# Patient Record
Sex: Male | Born: 1980 | Race: White | Hispanic: No | Marital: Married | Smoking: Never smoker
Health system: Southern US, Community
[De-identification: ages and names within clinical notes are randomized; demographics above are authoritative.]

## PROBLEM LIST (undated history)

## (undated) DIAGNOSIS — Z9889 Other specified postprocedural states: Secondary | ICD-10-CM

## (undated) DIAGNOSIS — Z800 Family history of malignant neoplasm of digestive organs: Secondary | ICD-10-CM

## (undated) DIAGNOSIS — G5601 Carpal tunnel syndrome, right upper limb: Secondary | ICD-10-CM

## (undated) DIAGNOSIS — I10 Essential (primary) hypertension: Secondary | ICD-10-CM

## (undated) DIAGNOSIS — K219 Gastro-esophageal reflux disease without esophagitis: Secondary | ICD-10-CM

## (undated) DIAGNOSIS — M1990 Unspecified osteoarthritis, unspecified site: Secondary | ICD-10-CM

## (undated) DIAGNOSIS — R112 Nausea with vomiting, unspecified: Secondary | ICD-10-CM

## (undated) DIAGNOSIS — E785 Hyperlipidemia, unspecified: Secondary | ICD-10-CM

## (undated) HISTORY — DX: Unspecified osteoarthritis, unspecified site: M19.90

## (undated) HISTORY — DX: Carpal tunnel syndrome, right upper limb: G56.01

## (undated) HISTORY — DX: Hyperlipidemia, unspecified: E78.5

## (undated) HISTORY — PX: LAMINECTOMY: SHX219

## (undated) HISTORY — DX: Gastro-esophageal reflux disease without esophagitis: K21.9

## (undated) HISTORY — PX: GUM SURGERY: SHX658

## (undated) HISTORY — DX: Family history of malignant neoplasm of digestive organs: Z80.0

---

## 2015-08-16 HISTORY — PX: LAMINECTOMY: SHX219

## 2016-08-15 HISTORY — PX: VARICOCELECTOMY: SHX1084

## 2017-08-15 HISTORY — PX: SKIN CANCER EXCISION: SHX779

## 2018-01-04 DIAGNOSIS — I10 Essential (primary) hypertension: Secondary | ICD-10-CM | POA: Insufficient documentation

## 2018-01-04 DIAGNOSIS — E7800 Pure hypercholesterolemia, unspecified: Secondary | ICD-10-CM | POA: Insufficient documentation

## 2019-01-20 ENCOUNTER — Other Ambulatory Visit: Payer: Self-pay

## 2019-01-20 ENCOUNTER — Ambulatory Visit (INDEPENDENT_AMBULATORY_CARE_PROVIDER_SITE_OTHER): Payer: 59

## 2019-01-20 ENCOUNTER — Ambulatory Visit (HOSPITAL_COMMUNITY)
Admission: EM | Admit: 2019-01-20 | Discharge: 2019-01-20 | Disposition: A | Discharge status: Home / Self Care | Payer: 59 | Attending: Family Medicine | Admitting: Family Medicine

## 2019-01-20 ENCOUNTER — Encounter (HOSPITAL_COMMUNITY): Payer: Self-pay | Admitting: Emergency Medicine

## 2019-01-20 DIAGNOSIS — S93401A Sprain of unspecified ligament of right ankle, initial encounter: Secondary | ICD-10-CM | POA: Diagnosis not present

## 2019-01-20 HISTORY — DX: Essential (primary) hypertension: I10

## 2019-01-20 NOTE — Discharge Instructions (Addendum)
Continue rest, icing, compression, elevation. Use crutches as needed for ambulation. Perform exercises as provided. May use Tylenol, Aleve OTC for pain relief.

## 2019-01-20 NOTE — ED Triage Notes (Signed)
Per pt he was going down steps and twisted his ankle and rolled it. Right ankle.

## 2019-01-20 NOTE — ED Provider Notes (Signed)
[facility]    CSN: [id number] Arrival date & time: 01/20/19  1316     History   Chief Complaint Chief Complaint  Patient presents with  . Ankle Injury    HPI Janalyn ShyBryan Oscarson is a 38 y.o. male with history of hypertension presenting for right ankle pain.  Patient states that he rolled his ankle (inversion) about 2 hours ago going down the stairs in the front of his house.  Patient denies pop/snap/tear sensation: He immediately put ice and Ace wrap on his ankle.  Patient was unable to bear weight and is endorsing severe tenderness posterior of his medial and lateral malleoli (medial > lateral).  Patient denies numbness/tingling of his feet.  Denies open wound, knee pain, toe pain, ipsilateral ankle pain.   Past Medical History:  Diagnosis Date  . Hypertension     There are no active problems to display for this patient.   History reviewed. No pertinent surgical history.     Home Medications    Prior to Admission medications   Not on File    Family History History reviewed. No pertinent family history.  Social History Social History   Tobacco Use  . Smoking status: Never Smoker  . Smokeless tobacco: Never Used  Substance Use Topics  . Alcohol use: Never    Frequency: Never  . Drug use: Never     Allergies   Patient has no allergy information on record.   Review of Systems As per HPI   Physical Exam Triage Vital Signs ED Triage Vitals  Enc Vitals Group     BP      Pulse      Resp      Temp      Temp src      SpO2      Weight      Height      Head Circumference      Peak Flow      Pain Score      Pain Loc      Pain Edu?      Excl. in GC?    No data found.  Updated Vital Signs BP (!) 148/91 (BP Location: Right Arm)   Pulse 70   Temp 98.3 F (36.8 C) (Oral)   Resp 16   SpO2 100%   Visual Acuity Right Eye Distance:   Left Eye Distance:   Bilateral Distance:    Right Eye Near:   Left Eye Near:    Bilateral Near:      Physical Exam Constitutional:      General: He is not in acute distress. HENT:     Head: Normocephalic and atraumatic.  Eyes:     General: No scleral icterus.    Pupils: Pupils are equal, round, and reactive to light.  Cardiovascular:     Rate and Rhythm: Normal rate.  Pulmonary:     Effort: Pulmonary effort is normal.  Musculoskeletal:     Comments: Mild swelling without ecchymosis of right ankle.  Patient is exquisitely tender to palpation of posterior medial malleoli.  Patient also endorsing tenderness along lateral malleoli and lateral aspect of foot dorsum.  Pulses 2+ bilaterally and symmetric.  Cap refill less than 2 seconds.  Patient has limited plantar and dorsiflexion second to pain.  5/5 strength with both, though patient reports pain with plantarflexion resistance.  Ipsilateral knee joint is stable, nontender with full active range of motion  Skin:    Coloration: Skin is not jaundiced  or pale.  Neurological:     Mental Status: He is alert and oriented to person, place, and time.      UC Treatments / Results  Labs (all labs ordered are listed, but only abnormal results are displayed) Labs Reviewed - No data to display  EKG None  Radiology Dg Ankle Complete Right  Result Date: 01/20/2019 CLINICAL DATA:  Twisting right ankle injury today on steps with pain. EXAM: RIGHT ANKLE - COMPLETE 3+ VIEW COMPARISON:  None. FINDINGS: There is no evidence of fracture, dislocation, or joint effusion. There is no evidence of arthropathy or other focal bone abnormality. Soft tissues are unremarkable. IMPRESSION: Negative. Electronically Signed   By: Marin Olp M.D.   On: 01/20/2019 14:41    Procedures Procedures (including critical care time)  Medications Ordered in UC Medications - No data to display  Initial Impression / Assessment and Plan / UC Course  I have reviewed the triage vital signs and the nursing notes.  Pertinent labs & imaging results that were available during  my care of the patient were reviewed by me and considered in my medical decision making (see chart for details).     With history of hypertension presenting for right ankle pain status post ankle inversion.  Patient's swelling and pain has so far been controlled with immediate ice, compression.  X-ray done in office, reviewed by radiology: Negative for fracture, effusion, dislocation.  Patient given ASO and crutches in office today with strict return precautions.  Continue compression, rest, ice, elevation.  Patient verbalized understanding. Final Clinical Impressions(s) / UC Diagnoses   Final diagnoses:  Sprain of right ankle, unspecified ligament, initial encounter     Discharge Instructions     Continue rest, icing, compression, elevation. Use crutches as needed for ambulation. Perform exercises as provided. May use Tylenol, Aleve OTC for pain relief.    ED Prescriptions    None     Controlled Substance Prescriptions [place] Controlled Substance Registry consulted? Not Applicable   Quincy Sheehan, [place] 01/20/19 1459

## 2019-08-07 ENCOUNTER — Encounter: Payer: Self-pay | Admitting: Internal Medicine

## 2019-08-07 ENCOUNTER — Telehealth: Payer: Self-pay | Admitting: Gastroenterology

## 2019-08-07 ENCOUNTER — Other Ambulatory Visit: Payer: Self-pay | Admitting: Internal Medicine

## 2019-08-07 NOTE — Telephone Encounter (Signed)
Dennis Blanchard- This patient was referred to [place] from Dr. Francesco Sor at [facility] is the DOD so I am sending it to you to help. In the referral under referral reason this is what it said "Grandmother has had a history of uterine cancer and the cell block had a mutation of MSH6.  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  "This is consistent with a microsatellite unstable tumor. Tumors showing this immunophenotype are frequently hereditary in etiology and genetic evaluation and testing should be considered in the appropriate clinical contect." Would he need to be seen here? Or what would you suggest? Thank you!

## 2019-08-07 NOTE — Telephone Encounter (Signed)
Dennis Blanchard, if they are asking for genetic evaluation then they should be contacted to send to Genetic counseling at the [facility].  If they are requesting a general consult for a colonoscopy please set up with an APP.  I am not really sure from this what they are asking for.  May need to contact the referring and ask specifically what they are asking for.  Genetic counseling is only at [facility] [phone]

## 2019-08-07 NOTE — Telephone Encounter (Signed)
[place]- Patient is to have a colonoscopy and GI consult. Left message for patient.

## 2019-08-08 ENCOUNTER — Encounter: Payer: Self-pay | Admitting: Gastroenterology

## 2019-09-13 ENCOUNTER — Ambulatory Visit (INDEPENDENT_AMBULATORY_CARE_PROVIDER_SITE_OTHER): Payer: 59 | Admitting: Gastroenterology

## 2019-09-13 ENCOUNTER — Encounter: Payer: Self-pay | Admitting: Gastroenterology

## 2019-09-13 VITALS — BP 110/70 | HR 60 | Temp 97.9°F | Ht 71.75 in | Wt 225.2 lb

## 2019-09-13 DIAGNOSIS — K219 Gastro-esophageal reflux disease without esophagitis: Secondary | ICD-10-CM

## 2019-09-13 DIAGNOSIS — R1310 Dysphagia, unspecified: Secondary | ICD-10-CM | POA: Diagnosis not present

## 2019-09-13 DIAGNOSIS — Z01818 Encounter for other preprocedural examination: Secondary | ICD-10-CM | POA: Diagnosis not present

## 2019-09-13 DIAGNOSIS — Z800 Family history of malignant neoplasm of digestive organs: Secondary | ICD-10-CM | POA: Diagnosis not present

## 2019-09-13 MED ORDER — NA SULFATE-K SULFATE-MG SULF 17.5-3.13-1.6 GM/177ML PO SOLN: 1.0000 | Freq: Once | ORAL | 0 refills | Status: AC

## 2019-09-13 NOTE — Patient Instructions (Signed)
You have been scheduled for an endoscopy and colonoscopy. Please follow the written instructions given to you at your visit today. Please pick up your prep supplies at the pharmacy within the next 1-3 days. If you use inhalers (even only as needed), please bring them with you on the day of your procedure.   Normal BMI (Body Mass Index- based on height and weight) is between 19 and 25. Your BMI today is Body mass index is 30.76 kg/m. Marland Kitchen Please consider follow up  regarding your BMI with your Primary Care Provider.  Thank you for choosing me and [facility].  Venita Lick. Pleas Koch., MD., Clementeen Graham

## 2019-09-13 NOTE — Progress Notes (Signed)
History of Present Illness: This is a 39 year old male referred by Sueanne Margarita, DO for the evaluation of a possible family history of Lynch syndrome and dysphagia. Paternal grandmother with uterine cancer in her 50s with MSH6 mutation. Maternal grandmother with colon cancer in her 21s.  No other family members with colon uterine small intestine or other cancers.  He has intermittent mild dysphagia with solid foods and pills.  He has had heartburn for several years.  When he remains on famotidine twice daily regularly both symptoms are controlled.  No other gastrointestinal complaints. Denies weight loss, abdominal pain, constipation, diarrhea, change in stool caliber, melena, hematochezia, nausea, vomiting, symptoms, chest pain.   Allergies  Allergen Reactions  . Amlodipine Swelling    Gums  . Lisinopril Swelling    Hand and feet   Outpatient Medications Prior to Visit  Medication Sig Dispense Refill  . chlorthalidone (HYGROTON) 25 MG tablet Take 25 mg by mouth daily.    . famotidine (PEPCID) 20 MG tablet Take 20 mg by mouth 2 (two) times daily.    Marland Kitchen gabapentin (NEURONTIN) 300 MG capsule Take 300 mg by mouth 3 (three) times daily.    Marland Kitchen loratadine (CLARITIN) 10 MG tablet Take 10 mg by mouth as needed for allergies.    . metoprolol succinate (TOPROL-XL) 25 MG 24 hr tablet Take 25 mg by mouth daily.    . rosuvastatin (CRESTOR) 10 MG tablet Take 10 mg by mouth at bedtime.     No facility-administered medications prior to visit.   Past Medical History:  Diagnosis Date  . Arthritis   . GERD (gastroesophageal reflux disease)   . Hyperlipidemia   . Hypertension    Past Surgical History:  Procedure Laterality Date  . GUM SURGERY    . LAMINECTOMY    . SKIN CANCER EXCISION  2019  . VARICOCELECTOMY  2018   Social History   Socioeconomic History  . Marital status: Unknown    Spouse name: Not on file  . Number of children: 2  . Years of education: Not on file  . Highest education  level: Not on file  Occupational History  . Occupation: Attorney  Tobacco Use  . Smoking status: Never Smoker  . Smokeless tobacco: Never Used  Substance and Sexual Activity  . Alcohol use: Yes    Comment: social  . Drug use: Never  . Sexual activity: Yes  Other Topics Concern  . Not on file  Social History Narrative  . Not on file   Social Determinants of Health   Financial Resource Strain:   . Difficulty of Paying Living Expenses: Not on file  Food Insecurity:   . Worried About Charity fundraiser in the Last Year: Not on file  . Ran Out of Food in the Last Year: Not on file  Transportation Needs:   . Lack of Transportation (Medical): Not on file  . Lack of Transportation (Non-Medical): Not on file  Physical Activity:   . Days of Exercise per Week: Not on file  . Minutes of Exercise per Session: Not on file  Stress:   . Feeling of Stress : Not on file  Social Connections:   . Frequency of Communication with Friends and Family: Not on file  . Frequency of Social Gatherings with Friends and Family: Not on file  . Attends Religious Services: Not on file  . Active Member of Clubs or Organizations: Not on file  . Attends Archivist Meetings:  Not on file  . Marital Status: Not on file   Family History  Problem Relation Age of Onset  . Hypertension Mother   . Depression Mother   . Hyperlipidemia Father   . Other Father        Multi system atrophy  . Cataracts Maternal Grandmother   . Colon cancer Maternal Grandmother   . CVA Maternal Grandfather   . Uterine cancer Paternal Grandmother        MSH6 mutation  . Rheum arthritis Paternal Grandmother   . Brain cancer Paternal Grandfather   . Breast cancer Paternal Aunt   . Aneurysm Maternal Uncle        brain  . Esophageal cancer Maternal Uncle   . Lung cancer Maternal Uncle       Review of Systems: Pertinent positive and negative review of systems were noted in the above HPI section. All other review of  systems were otherwise negative.   Physical Exam: General: Well developed, well nourished, no acute distress Head: Normocephalic and atraumatic Eyes:  sclerae anicteric, EOMI Ears: Normal auditory acuity Mouth: No deformity or lesions Neck: Supple, no masses or thyromegaly Lungs: Clear throughout to auscultation Heart: Regular rate and rhythm; no murmurs, rubs or bruits Abdomen: Soft, non tender and non distended. No masses, hepatosplenomegaly or hernias noted. Normal Bowel sounds Rectal: Deferred to colonoscopy  Musculoskeletal: Symmetrical with no gross deformities  Skin: No lesions on visible extremities Pulses:  Normal pulses noted Extremities: No clubbing, cyanosis, edema or deformities noted Neurological: Alert oriented x 4, grossly nonfocal Cervical Nodes:  No significant cervical adenopathy Inguinal Nodes: No significant inguinal adenopathy Psychological:  Alert and cooperative. Normal mood and affect   Assessment and Recommendations:  1. Possible family history of Lynch syndrome, paternal grandmother with uterine cancer with MSH6 mutation in her 70s however the lack of other family members with typical Lynch cancers does not fit a typical family history for Lynch.  Maternal grandmother in her 56s with colon cancer.  Advised considering genetic counseling and he states he will consider but he is not ready to proceed at this time.  Offered colonoscopy and EGD to evaluate for expression of Lynch GI traits.  He agrees to proceed.  He agrees to proceed.  Schedule colonoscopy and EGD. The risks (including bleeding, perforation, infection, missed lesions, medication reactions and possible hospitalization or surgery if complications occur), benefits, and alternatives to colonoscopy with possible biopsy and possible polypectomy were discussed with the patient and they consent to proceed.   2. Dysphagia. GERD.  Follow antireflux measures.  Continue famotidine 20 mg twice daily.  EGD as  above.   cc: Sueanne Margarita, DO [place],  [place] [address]

## 2019-09-23 ENCOUNTER — Encounter: Payer: 59 | Admitting: Gastroenterology

## 2020-07-21 ENCOUNTER — Other Ambulatory Visit: Payer: Self-pay | Admitting: Internal Medicine

## 2020-11-23 IMAGING — DX RIGHT ANKLE - COMPLETE 3+ VIEW
3 series · 3 of 3 positions shown · non-contrast
Comparison: None.

CLINICAL DATA: Twisting right ankle injury today on steps with
pain.

EXAM:
RIGHT ANKLE - COMPLETE 3+ VIEW

[ankle ap]
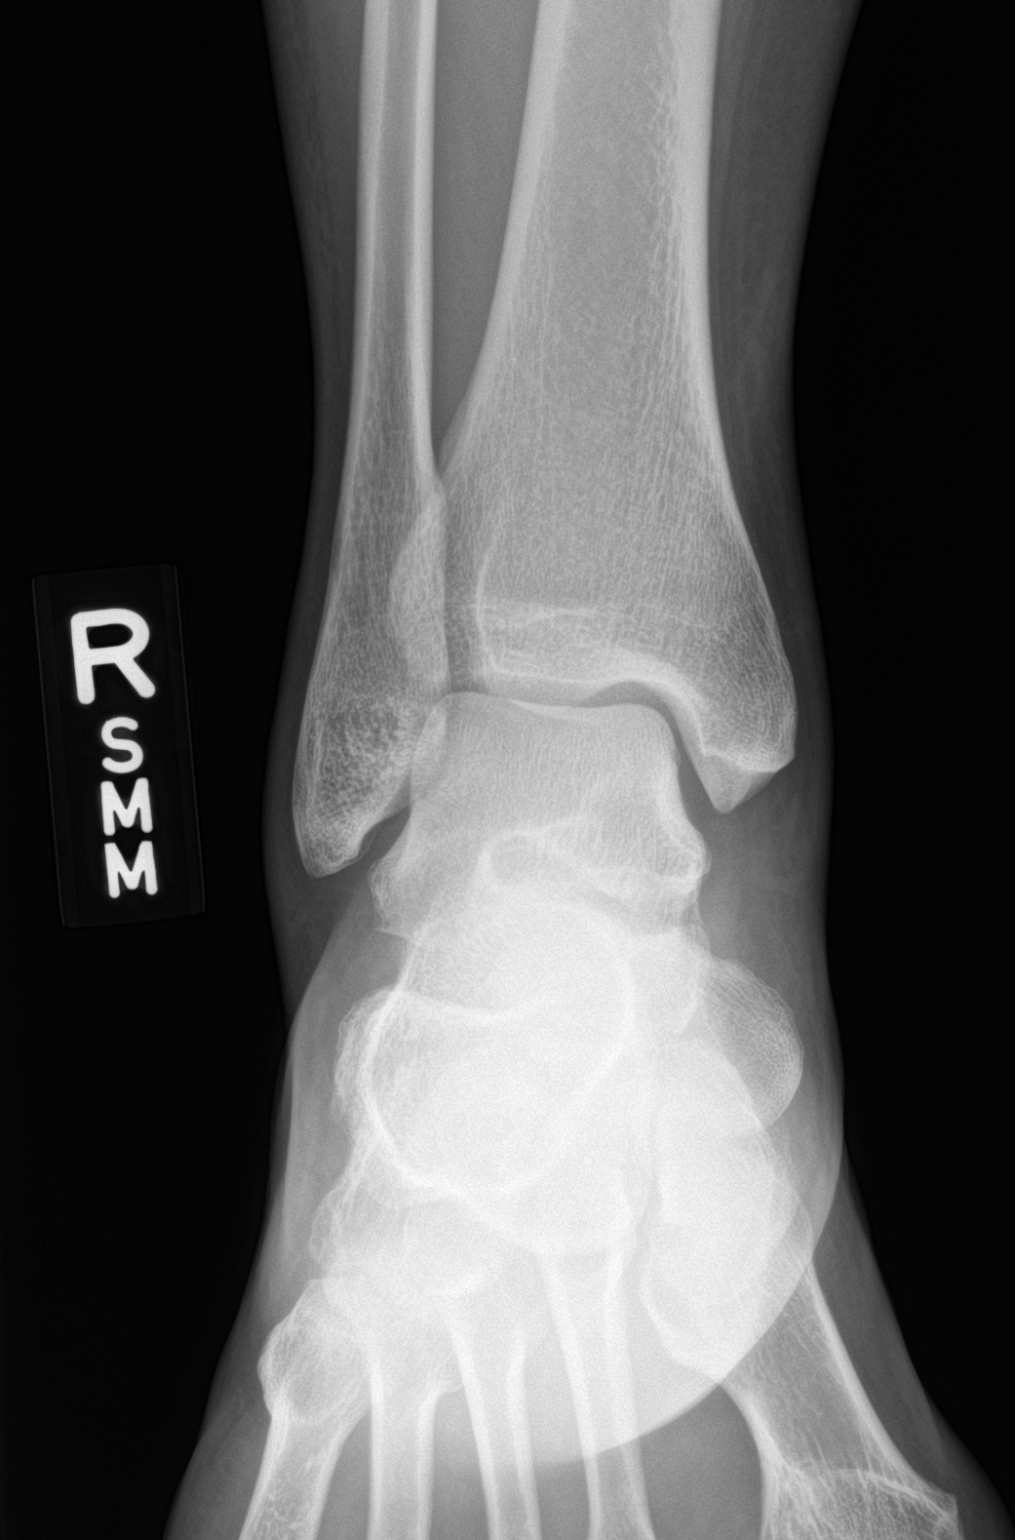

[ankle obl]
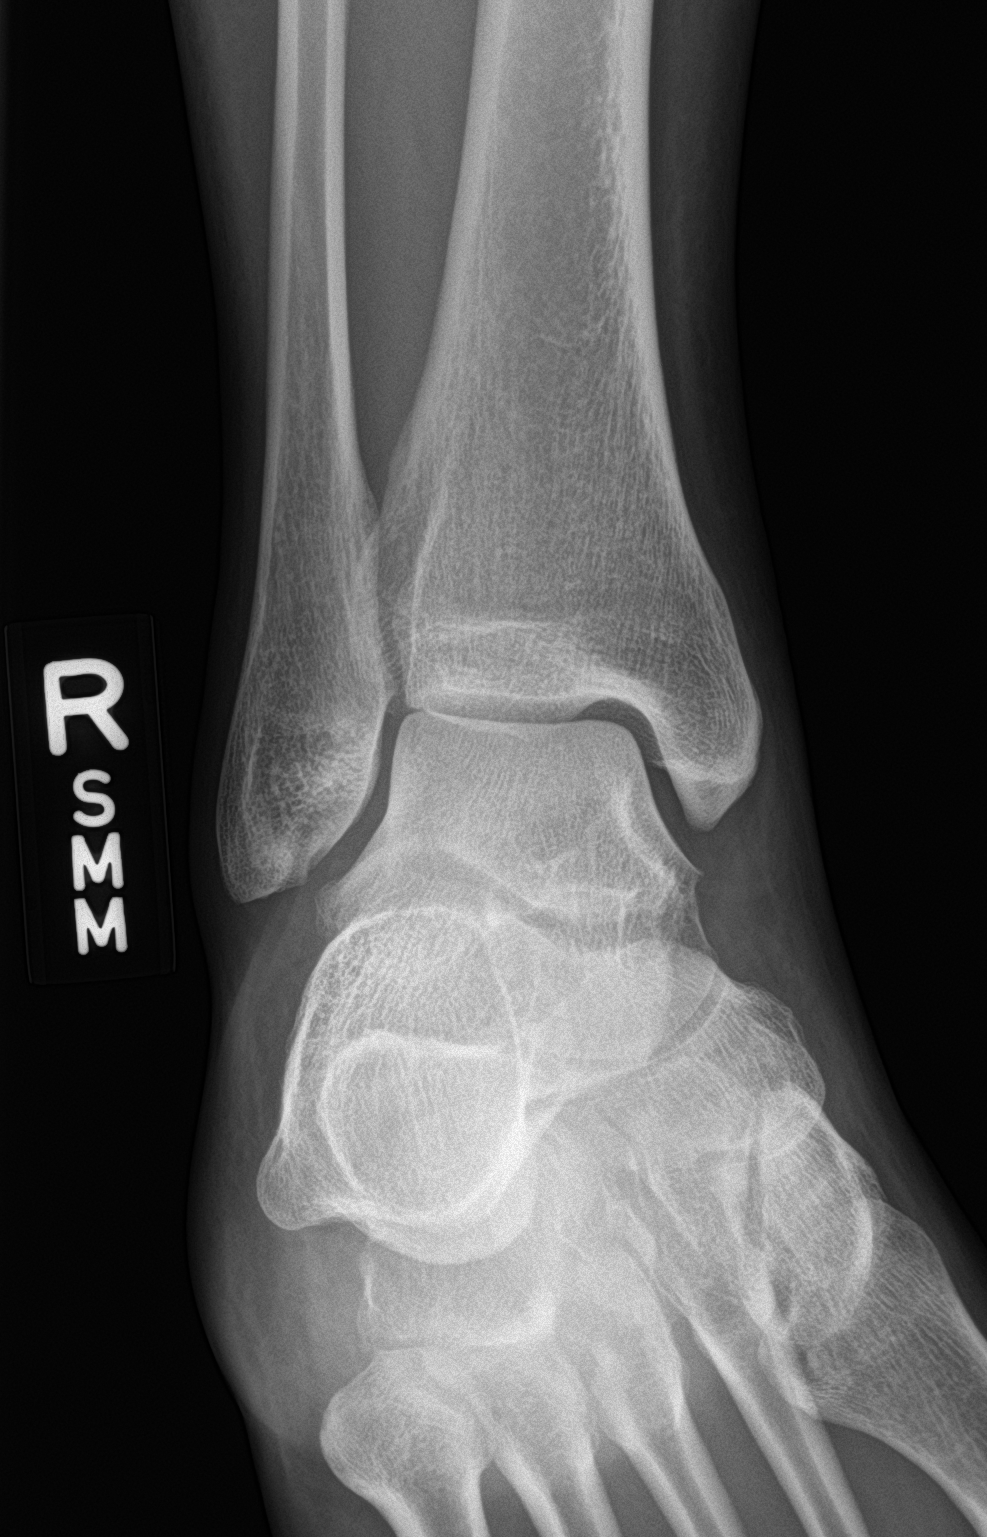

[ankle lat]
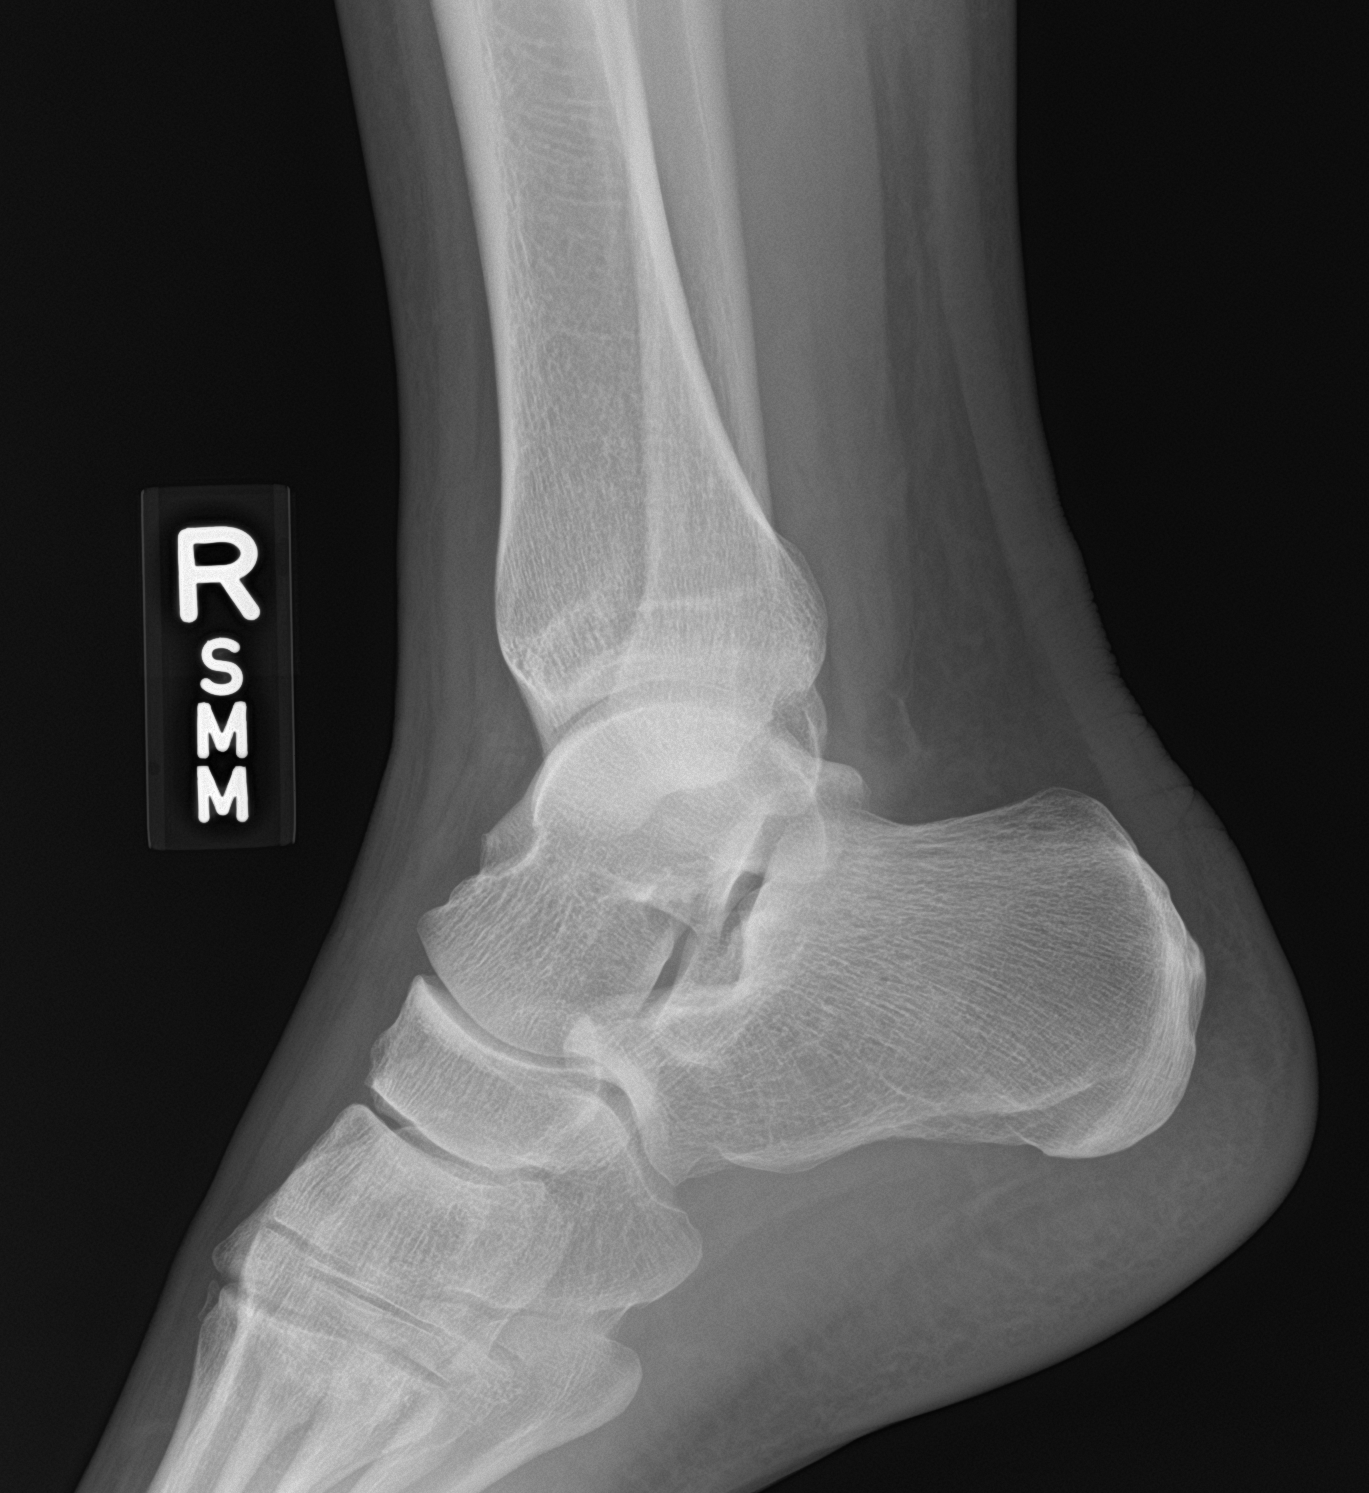

[3 of 3 positions shown; findings below may reference images not displayed]

FINDINGS: There is no evidence of fracture, dislocation, or joint effusion.
There is no evidence of arthropathy or other focal bone abnormality.
Soft tissues are unremarkable.
IMPRESSION: Negative.

## 2021-03-13 ENCOUNTER — Ambulatory Visit (HOSPITAL_COMMUNITY)
Admission: EM | Admit: 2021-03-13 | Discharge: 2021-03-13 | Discharge status: Against Medical Advice | Payer: No Typology Code available for payment source

## 2021-03-13 ENCOUNTER — Other Ambulatory Visit: Payer: Self-pay

## 2023-08-23 DIAGNOSIS — E785 Hyperlipidemia, unspecified: Secondary | ICD-10-CM | POA: Diagnosis not present

## 2023-08-23 DIAGNOSIS — Z125 Encounter for screening for malignant neoplasm of prostate: Secondary | ICD-10-CM | POA: Diagnosis not present

## 2023-08-30 ENCOUNTER — Encounter: Payer: Self-pay | Admitting: Gastroenterology

## 2023-08-30 DIAGNOSIS — Z0000 Encounter for general adult medical examination without abnormal findings: Secondary | ICD-10-CM | POA: Diagnosis not present

## 2023-08-30 DIAGNOSIS — R82998 Other abnormal findings in urine: Secondary | ICD-10-CM | POA: Diagnosis not present

## 2023-08-30 DIAGNOSIS — Z1331 Encounter for screening for depression: Secondary | ICD-10-CM | POA: Diagnosis not present

## 2023-08-30 DIAGNOSIS — R1313 Dysphagia, pharyngeal phase: Secondary | ICD-10-CM | POA: Diagnosis not present

## 2023-08-30 DIAGNOSIS — Z1339 Encounter for screening examination for other mental health and behavioral disorders: Secondary | ICD-10-CM | POA: Diagnosis not present

## 2023-09-12 ENCOUNTER — Other Ambulatory Visit (HOSPITAL_COMMUNITY): Payer: Self-pay | Admitting: Internal Medicine

## 2023-09-12 DIAGNOSIS — D225 Melanocytic nevi of trunk: Secondary | ICD-10-CM | POA: Diagnosis not present

## 2023-09-12 DIAGNOSIS — L710 Perioral dermatitis: Secondary | ICD-10-CM | POA: Diagnosis not present

## 2023-09-12 DIAGNOSIS — R1313 Dysphagia, pharyngeal phase: Secondary | ICD-10-CM

## 2023-09-12 DIAGNOSIS — D2272 Melanocytic nevi of left lower limb, including hip: Secondary | ICD-10-CM | POA: Diagnosis not present

## 2023-09-12 DIAGNOSIS — D2271 Melanocytic nevi of right lower limb, including hip: Secondary | ICD-10-CM | POA: Diagnosis not present

## 2023-09-22 NOTE — Progress Notes (Signed)
 Chief Complaint: GERD, dysphagia, hoarseness Primary GI Doctor: (previously Dr. Sandrea Cruel) Dr. Rosaline Coma  HPI: Patient is a 43 year old male patient with past medical history of arthritis, GERD, hyperlipidemia, hypertension, who was referred to me by Windell Hasty, DO on 09/11/23  for a complaint of GERD, dysphagia, hoarseness .    On 09/08/23 patient seen by PCP. Patient c/o dysphagia. Patient was taking famotidine  20 mg twice daily for GERD.  PCP change medication to omeprazole  40 mg p.o. daily. Also ordered esophagram scheduled for 2/13. Considering ENT referral for raspiness but hoping that PPI will help with any potential silent reflux symptoms.  On 09/13/2019 patient last seen in GI office by Dr. Sandrea Cruel for evaluation of a possible family history of Lynch syndrome and dysphagia.Paternal grandmother with uterine cancer in her 61s with MSH6 mutation. No other family members with colon uterine small intestine or other cancers. Advised considering genetic counseling. Offered colonoscopy and EGD to evaluate for expression of Lynch GI traits.  He agrees to proceed. Procedures cancelled.  Interval History     Patient has history GERD. Patient reports initially taking Nexium for several years in the past for pyrosis. Patient recently was taking Pepcid  20 mg twice daily. His PCP switched to esomeprazole 40 mg in the morning after he reported issues with dysphagia and hoarseness. Patient reports changes in voice for the past year. He has also had esophageal dysphagia for at least 4 years, but reports recently it has increased in severity. He reports it occurs at least twice a week with solids and pills. He always makes sure he has water near by to get things down. He also reports he clears throat often. Patient denies nausea, vomiting, or weight loss. Patient denies altered bowel habits, abdominal pain, or rectal bleeding. Social alcohol  use. Nonsmoker. Patient's family history includes Paternal grandmother with  uterine cancer in her 28s with MSH6 mutation. Never had EGD or colon.  Wt Readings from Last 3 Encounters:  09/25/23 198 lb 3.2 oz (89.9 kg)  09/13/19 225 lb 4 oz (102.2 kg)    Past Medical History:  Diagnosis Date   Arthritis    Carpal tunnel syndrome of right wrist    Family history of Lynch syndrome    GERD (gastroesophageal reflux disease)    Hyperlipidemia    Hypertension    OA (osteoarthritis)    Past Surgical History:  Procedure Laterality Date   GUM SURGERY     LAMINECTOMY  2017   SKIN CANCER EXCISION  2019   VARICOCELECTOMY  2018   Current Outpatient Medications  Medication Sig Dispense Refill   esomeprazole (NEXIUM) 20 MG capsule Take 40 mg by mouth daily at 12 noon.     chlorthalidone (HYGROTON) 25 MG tablet Take 25 mg by mouth daily.     famotidine  (PEPCID ) 20 MG tablet Take 20 mg by mouth 2 (two) times daily. (Patient not taking: Reported on 09/25/2023)     gabapentin  (NEURONTIN ) 300 MG capsule Take 300 mg by mouth 3 (three) times daily.     loratadine (CLARITIN) 10 MG tablet Take 10 mg by mouth as needed for allergies.     metoprolol succinate (TOPROL-XL) 25 MG 24 hr tablet Take 25 mg by mouth daily. (Patient not taking: Reported on 09/25/2023)     rosuvastatin (CRESTOR) 10 MG tablet Take 10 mg by mouth at bedtime.     No current facility-administered medications for this visit.   Allergies as of 09/25/2023 - Review Complete 09/25/2023  Allergen  Reaction Noted   Amlodipine Swelling 09/13/2019   Lisinopril Swelling 09/13/2019   Family History  Problem Relation Age of Onset   Hypertension Mother    Depression Mother    Hyperlipidemia Father    Other Father        Multi system atrophy   Cataracts Maternal Grandmother    Colon cancer Maternal Grandmother    CVA Maternal Grandfather    Uterine cancer Paternal Grandmother        MSH6 mutation   Rheum arthritis Paternal Grandmother    Brain cancer Paternal Grandfather    Aneurysm Maternal Uncle        brain    Esophageal cancer Maternal Uncle    Lung cancer Maternal Uncle    Breast cancer Paternal Aunt     Review of Systems:    Constitutional: No weight loss, fever, chills, weakness or fatigue HEENT: Eyes: No change in vision               Ears, Nose, Throat:  No change in hearing or congestion Skin: No rash or itching Cardiovascular: No chest pain, chest pressure or palpitations   Respiratory: No SOB or cough Gastrointestinal: See HPI and otherwise negative Genitourinary: No dysuria or change in urinary frequency Neurological: No headache, dizziness or syncope Musculoskeletal: No new muscle or joint pain Hematologic: No bleeding or bruising Psychiatric: No history of depression or anxiety    Physical Exam:  Vital signs: BP 130/80 (BP Location: Left Arm, Patient Position: Sitting, Cuff Size: Normal)   Pulse 73   Ht 5' 11.75" (1.822 m)   Wt 198 lb 3.2 oz (89.9 kg)   SpO2 99%   BMI 27.07 kg/m   Constitutional:   Pleasant Caucasian male appears to be in NAD, Well developed, Well nourished, alert and cooperative Neck:  Supple Throat: Oral cavity and pharynx without inflammation, swelling or lesion.  Respiratory: Respirations even and unlabored. Lungs clear to auscultation bilaterally.   No wheezes, crackles, or rhonchi.  Cardiovascular: Normal S1, S2. Regular rate and rhythm. No peripheral edema, cyanosis or pallor.  Gastrointestinal:  Soft, nondistended, nontender. No rebound or guarding. Normal bowel sounds. No appreciable masses or hepatomegaly. Rectal:  Not performed.  Skin:   Dry and intact without significant lesions or rashes. Psychiatric: Oriented to person, place and time. Demonstrates good judgement and reason without abnormal affect or behaviors.  RELEVANT LABS AND IMAGING: 08/23/2023 labs show: Creatinine 1.1, normal LFTs, BUN 16, platelets 291, WBC 5.6, hemoglobin 15.8, TSH 2.49  Assessment: Encounter Diagnoses  Name Primary?   Esophageal dysphagia Yes    Gastroesophageal reflux disease without esophagitis    Hoarseness    Globus sensation       43 year old male patient with esophageal dysphagia for the past 4 years that has increased in severity despite being on PPI therapy and H2-receptor antagonists. Will schedule endoscopy with possible dilatation. Also will obtain biopsies to r/o EOE. 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 We discussed continuing the Omeprazole  in the morning and adding Pepcid  at bedtime with strict GERD diet, no late meals.  We discussed his colon cancer screening colonoscopy due at age 58.   Plan: -Continue Esomeprazole 40 mg po daily  - Start Famotidine  20 mg at bedtime - Strict GERD diet, no late meals  -Schedule EGD with dilatation in [facility] with Dr. Rosaline Coma. The risks and benefits of EGD with possible biopsies and esophageal dilation were discussed with the patient who agrees  to proceed.    Thank you for the courtesy of this consult. Please call me with any questions or concerns.   Sukhraj Esquivias, FNP-C [facility] 09/25/2023, 11:50 AM  Cc: Windell Hasty, DO

## 2023-09-25 ENCOUNTER — Encounter: Payer: Self-pay | Admitting: Gastroenterology

## 2023-09-25 ENCOUNTER — Ambulatory Visit (INDEPENDENT_AMBULATORY_CARE_PROVIDER_SITE_OTHER): Payer: Self-pay | Admitting: Gastroenterology

## 2023-09-25 VITALS — BP 130/80 | HR 73 | Ht 71.75 in | Wt 198.2 lb

## 2023-09-25 DIAGNOSIS — R1310 Dysphagia, unspecified: Secondary | ICD-10-CM | POA: Diagnosis not present

## 2023-09-25 DIAGNOSIS — R490 Dysphonia: Secondary | ICD-10-CM

## 2023-09-25 DIAGNOSIS — R09A2 Foreign body sensation, throat: Secondary | ICD-10-CM

## 2023-09-25 DIAGNOSIS — K219 Gastro-esophageal reflux disease without esophagitis: Secondary | ICD-10-CM

## 2023-09-25 DIAGNOSIS — R1319 Other dysphagia: Secondary | ICD-10-CM

## 2023-09-25 MED ORDER — FAMOTIDINE 20 MG PO TABS: 20.0000 mg | ORAL_TABLET | Freq: Every day | ORAL | 5 refills | Status: DC

## 2023-09-25 NOTE — Patient Instructions (Addendum)
 We have sent the following medications to your pharmacy for you to pick up at your convenience: Famotidine  20mg  at bedtime  You have been scheduled for an endoscopy. Please follow written instructions given to you at your visit today.  If you use inhalers (even only as needed), please bring them with you on the day of your procedure.  If you take any of the following medications, they will need to be adjusted prior to your procedure:   DO NOT TAKE 7 DAYS PRIOR TO TEST- Trulicity (dulaglutide) Ozempic, Wegovy (semaglutide) Mounjaro (tirzepatide) Bydureon Bcise (exanatide extended release)  DO NOT TAKE 1 DAY PRIOR TO YOUR TEST Rybelsus (semaglutide) Adlyxin (lixisenatide) Victoza (liraglutide) Byetta (exanatide) ___________________________________________________________________________   _______________________________________________________  If your blood pressure at your visit was 140/90 or greater, please contact your primary care physician to follow up on this.  _______________________________________________________  If you are age 6 or older, your body mass index should be between 23-30. Your Body mass index is 27.07 kg/m. If this is out of the aforementioned range listed, please consider follow up with your Primary Care Provider.  If you are age 49 or younger, your body mass index should be between 19-25. Your Body mass index is 27.07 kg/m. If this is out of the aformentioned range listed, please consider follow up with your Primary Care Provider.   ________________________________________________________  The [facility] providers would like to encourage you to use MYCHART to communicate with providers for non-urgent requests or questions.  Due to long hold times on the telephone, sending your provider a message by [facility] may be a faster and more efficient way to get a response.  Please allow 48 business hours for a response.  Please remember that this is for non-urgent  requests.  _______________________________________________________ Thank you for trusting me with your gastrointestinal care!   Deanna May, NP

## 2023-09-26 NOTE — Progress Notes (Signed)
I agree with the assessment and plan as outlined by Ms. May.

## 2023-09-28 ENCOUNTER — Other Ambulatory Visit (HOSPITAL_COMMUNITY): Payer: Self-pay

## 2023-09-28 ENCOUNTER — Encounter (HOSPITAL_COMMUNITY): Payer: Self-pay

## 2023-10-13 DIAGNOSIS — R1313 Dysphagia, pharyngeal phase: Secondary | ICD-10-CM | POA: Diagnosis not present

## 2023-10-13 DIAGNOSIS — M9903 Segmental and somatic dysfunction of lumbar region: Secondary | ICD-10-CM | POA: Diagnosis not present

## 2023-10-16 DIAGNOSIS — Z6826 Body mass index (BMI) 26.0-26.9, adult: Secondary | ICD-10-CM | POA: Diagnosis not present

## 2023-10-16 DIAGNOSIS — M5416 Radiculopathy, lumbar region: Secondary | ICD-10-CM | POA: Diagnosis not present

## 2023-10-24 ENCOUNTER — Encounter: Payer: Self-pay | Admitting: Certified Registered Nurse Anesthetist

## 2023-10-27 ENCOUNTER — Encounter: Payer: Self-pay | Admitting: Internal Medicine

## 2023-10-27 ENCOUNTER — Ambulatory Visit: Payer: BC Managed Care – PPO | Admitting: Internal Medicine

## 2023-10-27 VITALS — BP 96/61 | HR 78 | Temp 98.8°F | Resp 18 | Ht 71.75 in | Wt 198.0 lb

## 2023-10-27 DIAGNOSIS — K2950 Unspecified chronic gastritis without bleeding: Secondary | ICD-10-CM

## 2023-10-27 DIAGNOSIS — R1310 Dysphagia, unspecified: Secondary | ICD-10-CM | POA: Diagnosis not present

## 2023-10-27 DIAGNOSIS — K259 Gastric ulcer, unspecified as acute or chronic, without hemorrhage or perforation: Secondary | ICD-10-CM

## 2023-10-27 DIAGNOSIS — K219 Gastro-esophageal reflux disease without esophagitis: Secondary | ICD-10-CM

## 2023-10-27 DIAGNOSIS — R1319 Other dysphagia: Secondary | ICD-10-CM

## 2023-10-27 MED ORDER — SODIUM CHLORIDE 0.9 % IV SOLN: 500.0000 mL | INTRAVENOUS | Status: DC

## 2023-10-27 MED ORDER — OMEPRAZOLE 40 MG PO CPDR: 40.0000 mg | DELAYED_RELEASE_CAPSULE | Freq: Every day | ORAL | 3 refills | Status: DC

## 2023-10-27 NOTE — Op Note (Signed)
 [facility] Patient Name: Dennis Blanchard Procedure Date: 10/27/2023 2:10 PM MRN: [id number] Endoscopist: Madelyn Brunner [place] , , [id number] Age: 43 Referring MD:  Date of Birth: [birth year] Gender: Male Account #: [id number] Procedure:                Upper GI endoscopy Indications:              Dysphagia, Heartburn Medicines:                Monitored Anesthesia Care Procedure:                Pre-Anesthesia Assessment:                           - Prior to the procedure, a History and Physical                            was performed, and patient medications and                            allergies were reviewed. The patient's tolerance of                            previous anesthesia was also reviewed. The risks                            and benefits of the procedure and the sedation                            options and risks were discussed with the patient.                            All questions were answered, and informed consent                            was obtained. Prior Anticoagulants: The patient has                            taken no anticoagulant or antiplatelet agents. ASA                            Grade Assessment: II - A patient with mild systemic                            disease. After reviewing the risks and benefits,                            the patient was deemed in satisfactory condition to                            undergo the procedure.                           After obtaining informed consent, the endoscope was  passed under direct vision. Throughout the                            procedure, the patient's blood pressure, pulse, and                            oxygen saturations were monitored continuously. The                            GIF [id number] #[id number] was introduced through the                            mouth, and advanced to the fourth part of duodenum.                            The upper GI endoscopy was  accomplished without                            difficulty. The patient tolerated the procedure                            well. Scope In: Scope Out: Findings:                 The examined esophagus was normal. A guidewire was                            placed and the scope was withdrawn. Dilation was                            performed with a Savary dilator with mild                            resistance at 19 mm. Biopsies were taken with a                            cold forceps for histology.                           Localized inflammation characterized by congestion                            (edema), erosions and erythema was found in the                            gastric body. Biopsies were taken with a cold                            forceps for histology.                           The examined duodenum was normal. Complications:            No immediate complications. Estimated Blood Loss:     Estimated blood loss was minimal. Impression:               -  Normal esophagus. Dilated. Biopsied.                           - Gastritis. Biopsied.                           - Normal examined duodenum. Recommendation:           - Discharge patient to home (with escort).                           - Await pathology results.                           - Increase omeprazole to 40 mg BID for 8 weeks,                            then decrease to QD                           - Return to GI clinic in 2-3 months.                           - The findings and recommendations were discussed                            with the patient. Dr Particia Lather "[name]" Leonides Schanz,  10/27/2023 2:53:13 PM

## 2023-10-27 NOTE — Progress Notes (Signed)
 GASTROENTEROLOGY PROCEDURE H&P NOTE   Primary Care Physician: Charlane Ferretti, DO    Reason for Procedure:   Dysphagia  Plan:    EGD  Patient is appropriate for endoscopic procedure(s) in the ambulatory ([facility]) setting.  The nature of the procedure, as well as the risks, benefits, and alternatives were carefully and thoroughly reviewed with the patient. Ample time for discussion and questions allowed. The patient understood, was satisfied, and agreed to proceed.     HPI: Dennis Blanchard is a 43 y.o. male who presents for EGD for evaluation of dysphagia .  Patient was most recently seen in the Gastroenterology Clinic on 09/25/23.  No interval change in medical history since that appointment. Please refer to that note for full details regarding GI history and clinical presentation.   Past Medical History:  Diagnosis Date   Arthritis    Carpal tunnel syndrome of right wrist    Family history of Lynch syndrome    GERD (gastroesophageal reflux disease)    Hyperlipidemia    Hypertension    OA (osteoarthritis)     Past Surgical History:  Procedure Laterality Date   GUM SURGERY     LAMINECTOMY  2017   SKIN CANCER EXCISION  2019   VARICOCELECTOMY  2018    Prior to Admission medications   Medication Sig Start Date End Date Taking? Authorizing Provider  cyclobenzaprine (FLEXERIL) 5 MG tablet 1-2 tablets three times daily as needed, as tolerated, caution with sedation Orally for 90 days (180 is a 13m supply)   Yes [provider]  famotidine (PEPCID) 20 MG tablet Take 1 tablet (20 mg total) by mouth at bedtime. 09/25/23  Yes May, Deanna J, NP  gabapentin (NEURONTIN) 300 MG capsule Take 300 mg by mouth 3 (three) times daily. 08/30/19  Yes [provider]  loratadine (CLARITIN) 10 MG tablet Take 10 mg by mouth as needed for allergies.   Yes [provider]  omeprazole (PRILOSEC) 40 MG capsule Take by mouth.   Yes [provider]  rosuvastatin (CRESTOR)  10 MG tablet Take 10 mg by mouth at bedtime. 09/12/19  Yes [provider]  traMADol (ULTRAM) 50 MG tablet half to 1 tablet Oral bid prn as needed for severe pain, caution with sedation for 30 days 10/24/23  Yes [provider]    Current Outpatient Medications  Medication Sig Dispense Refill   cyclobenzaprine (FLEXERIL) 5 MG tablet 1-2 tablets three times daily as needed, as tolerated, caution with sedation Orally for 90 days (180 is a 41m supply)     famotidine (PEPCID) 20 MG tablet Take 1 tablet (20 mg total) by mouth at bedtime. 30 tablet 5   gabapentin (NEURONTIN) 300 MG capsule Take 300 mg by mouth 3 (three) times daily.     loratadine (CLARITIN) 10 MG tablet Take 10 mg by mouth as needed for allergies.     omeprazole (PRILOSEC) 40 MG capsule Take by mouth.     rosuvastatin (CRESTOR) 10 MG tablet Take 10 mg by mouth at bedtime.     traMADol (ULTRAM) 50 MG tablet half to 1 tablet Oral bid prn as needed for severe pain, caution with sedation for 30 days     Current Facility-Administered Medications  Medication Dose Route Frequency Provider Last Rate Last Admin   0.9 %  sodium chloride infusion  500 mL Intravenous Continuous Imogene Burn, MD        Allergies as of 10/27/2023 - Review Complete 10/27/2023  Allergen Reaction  Noted   Amlodipine Swelling 09/13/2019   Lisinopril Swelling 09/13/2019    Family History  Problem Relation Age of Onset   Hypertension Mother    Depression Mother    Hyperlipidemia Father    Other Father        Multi system atrophy   Cataracts Maternal Grandmother    Colon cancer Maternal Grandmother    CVA Maternal Grandfather    Uterine cancer Paternal Grandmother        MSH6 mutation   Rheum arthritis Paternal Grandmother    Brain cancer Paternal Grandfather    Aneurysm Maternal Uncle        brain   Esophageal cancer Maternal Uncle    Lung cancer Maternal Uncle    Breast cancer Paternal Aunt     Social History   Socioeconomic  History   Marital status: Married    Spouse name: Not on file   Number of children: 2   Years of education: Not on file   Highest education level: Not on file  Occupational History   Occupation: Attorney  Tobacco Use   Smoking status: Never   Smokeless tobacco: Never  Vaping Use   Vaping status: Never Used  Substance and Sexual Activity   Alcohol use: Yes    Comment: social   Drug use: Never   Sexual activity: Yes  Other Topics Concern   Not on file  Social History Narrative   Not on file   Social Drivers of Health   Financial Resource Strain: Not on file  Food Insecurity: Not on file  Transportation Needs: Not on file  Physical Activity: Not on file  Stress: Not on file  Social Connections: Not on file  Intimate Partner Violence: Not on file    Physical Exam: Vital signs in last 24 hours: BP (!) 145/90   Pulse 82   Temp 98.8 F (37.1 C)   Ht 5' 11.75" (1.822 m)   Wt 198 lb (89.8 kg)   SpO2 100%   BMI 27.04 kg/m  GEN: NAD EYE: Sclerae anicteric ENT: MMM CV: Non-tachycardic Pulm: No increased WOB GI: Soft NEURO:  Alert & Oriented   Eulah Pont, MD [facility]   10/27/2023 2:15 PM'

## 2023-10-27 NOTE — Progress Notes (Signed)
 Called to room to assist during endoscopic procedure.  Patient ID and intended procedure confirmed with present staff. Received instructions for my participation in the procedure from the performing physician.

## 2023-10-27 NOTE — Progress Notes (Signed)
 Report given to PACU, vss

## 2023-10-27 NOTE — Patient Instructions (Addendum)
-   Increase omeprazole to 40 mg BID for 8 weeks, then decrease to QD - Return to GI clinic in 2-3 months.   YOU HAD AN ENDOSCOPIC PROCEDURE TODAY AT THE [facility]:   Refer to the procedure report that was given to you for any specific questions about what was found during the examination.  If the procedure report does not answer your questions, please call your gastroenterologist to clarify.  If you requested that your care partner not be given the details of your procedure findings, then the procedure report has been included in a sealed envelope for you to review at your convenience later.  YOU SHOULD EXPECT: Some feelings of bloating in the abdomen. Passage of more gas than usual.  Walking can help get rid of the air that was put into your GI tract during the procedure and reduce the bloating. If you had a lower endoscopy (such as a colonoscopy or flexible sigmoidoscopy) you may notice spotting of blood in your stool or on the toilet paper. If you underwent a bowel prep for your procedure, you may not have a normal bowel movement for a few days.  Please Note:  You might notice some irritation and congestion in your nose or some drainage.  This is from the oxygen used during your procedure.  There is no need for concern and it should clear up in a day or so.  SYMPTOMS TO REPORT IMMEDIATELY:  Following upper endoscopy (EGD)  Vomiting of blood or coffee ground material  New chest pain or pain under the shoulder blades  Painful or persistently difficult swallowing  New shortness of breath  Fever of 100F or higher  Black, tarry-looking stools  For urgent or emergent issues, a gastroenterologist can be reached at any hour by calling [phone]. Do not use MyChart messaging for urgent concerns.    DIET:  We do recommend a small meal at first, but then you may proceed to your regular diet.  Drink plenty of fluids but you should avoid alcoholic beverages for 24  hours.  ACTIVITY:  You should plan to take it easy for the rest of today and you should NOT DRIVE or use heavy machinery until tomorrow (because of the sedation medicines used during the test).    FOLLOW UP: Our staff will call the number listed on your records the next business day following your procedure.  We will call around 7:15- 8:00 am to check on you and address any questions or concerns that you may have regarding the information given to you following your procedure. If we do not reach you, we will leave a message.     If any biopsies were taken you will be contacted by phone or by letter within the next 1-3 weeks.  Please call us at [phone] if you have not heard about the biopsies in 3 weeks.    SIGNATURES/CONFIDENTIALITY: You and/or your care partner have signed paperwork which will be entered into your electronic medical record.  These signatures attest to the fact that that the information above on your After Visit Summary has been reviewed and is understood.  Full responsibility of the confidentiality of this discharge information lies with you and/or your care-partner.

## 2023-10-27 NOTE — Progress Notes (Signed)
 Pt's states no medical or surgical changes since previsit or office visit.

## 2023-10-30 ENCOUNTER — Telehealth: Payer: Self-pay | Admitting: *Deleted

## 2023-10-30 NOTE — Telephone Encounter (Signed)
  Follow up Call-     10/27/2023    1:03 PM  Call back number  Post procedure Call Back phone  # [phone]  Permission to leave phone message Yes     Patient questions:  Do you have a fever, pain , or abdominal swelling? No. Pain Score  0 *  Have you tolerated food without any problems? Yes.    Have you been able to return to your normal activities? Yes.    Do you have any questions about your discharge instructions: Diet   No. Medications  No. Follow up visit  No.  Do you have questions or concerns about your Care? No.  Actions: * If pain score is 4 or above: No action needed, pain <4.

## 2023-10-31 DIAGNOSIS — M545 Low back pain, unspecified: Secondary | ICD-10-CM | POA: Diagnosis not present

## 2023-11-01 ENCOUNTER — Encounter: Payer: Self-pay | Admitting: Internal Medicine

## 2023-11-01 LAB — SURGICAL PATHOLOGY

## 2023-11-02 DIAGNOSIS — Z6826 Body mass index (BMI) 26.0-26.9, adult: Secondary | ICD-10-CM | POA: Diagnosis not present

## 2023-11-02 DIAGNOSIS — M5416 Radiculopathy, lumbar region: Secondary | ICD-10-CM | POA: Diagnosis not present

## 2023-11-02 DIAGNOSIS — M5127 Other intervertebral disc displacement, lumbosacral region: Secondary | ICD-10-CM | POA: Diagnosis not present

## 2023-11-08 ENCOUNTER — Other Ambulatory Visit: Payer: Self-pay | Admitting: Neurosurgery

## 2023-11-08 DIAGNOSIS — K2950 Unspecified chronic gastritis without bleeding: Secondary | ICD-10-CM | POA: Diagnosis not present

## 2023-11-15 NOTE — Progress Notes (Signed)
 Surgical Instructions   Your procedure is scheduled on Friday, April 11th, 2025. Report to [facility] Main Entrance "A" at 8:00 A.M., then check in with the Admitting office. Any questions or running late day of surgery: call [phone]  Questions prior to your surgery date: call [phone], Monday-Friday, 8am-4pm. If you experience any cold or flu symptoms such as cough, fever, chills, shortness of breath, etc. between now and your scheduled surgery, please notify us at the above number.     Remember:  Do not eat after midnight the night before your surgery  You may drink clear liquids until 7:00 the morning of your surgery.   Clear liquids allowed are: Water, Non-Citrus Juices (without pulp), Carbonated Beverages, Clear Tea (no milk, honey, etc.), Black Coffee Only (NO MILK, CREAM OR POWDERED CREAMER of any kind), and Gatorade.    Take these medicines the morning of surgery with A SIP OF WATER: Cetirizine (Zyrtec) Cyclobenzaprine (Flexeril) Gabapentin (Neurontin) Omeprazole (Prilosec)   May take these medicines IF NEEDED: Tramadol (Ultram)    One week prior to surgery, STOP taking any Aspirin (unless otherwise instructed by your surgeon) Aleve, Naproxen, Ibuprofen, Motrin, Advil, Goody's, BC's, all herbal medications, fish oil, and non-prescription vitamins.                     Do NOT Smoke (Tobacco/Vaping) for 24 hours prior to your procedure.  If you use a CPAP at night, you may bring your mask/headgear for your overnight stay.   You will be asked to remove any contacts, glasses, piercing's, hearing aid's, dentures/partials prior to surgery. Please bring cases for these items if needed.    Patients discharged the day of surgery will not be allowed to drive home, and someone needs to stay with them for 24 hours.  SURGICAL WAITING ROOM VISITATION Patients may have no more than 2 support people in the waiting area - these visitors may rotate.   Pre-op nurse will  coordinate an appropriate time for 1 ADULT support person, who may not rotate, to accompany patient in pre-op.  Children under the age of 50 must have an adult with them who is not the patient and must remain in the main waiting area with an adult.  If the patient needs to stay at the hospital during part of their recovery, the visitor guidelines for inpatient rooms apply.  Please refer to the [facility] website for the visitor guidelines for any additional information.   If you received a COVID test during your pre-op visit  it is requested that you wear a mask when out in public, stay away from anyone that may not be feeling well and notify your surgeon if you develop symptoms. If you have been in contact with anyone that has tested positive in the last 10 days please notify you surgeon.      Pre-operative 5 CHG Bathing Instructions   You can play a key role in reducing the risk of infection after surgery. Your skin needs to be as free of germs as possible. You can reduce the number of germs on your skin by washing with CHG (chlorhexidine gluconate) soap before surgery. CHG is an antiseptic soap that kills germs and continues to kill germs even after washing.   DO NOT use if you have an allergy to chlorhexidine/CHG or antibacterial soaps. If your skin becomes reddened or irritated, stop using the CHG and notify one of our RNs at [phone].   Please shower with the CHG  soap starting 4 days before surgery using the following schedule:     Please keep in mind the following:  DO NOT shave, including legs and underarms, starting the day of your first shower.   You may shave your face at any point before/day of surgery.  Place clean sheets on your bed the day you start using CHG soap. Use a clean washcloth (not used since being washed) for each shower. DO NOT sleep with pets once you start using the CHG.   CHG Shower Instructions:  Wash your face and private area with normal soap. If  you choose to wash your hair, wash first with your normal shampoo.  After you use shampoo/soap, rinse your hair and body thoroughly to remove shampoo/soap residue.  Turn the water OFF and apply about 3 tablespoons (45 ml) of CHG soap to a CLEAN washcloth.  Apply CHG soap ONLY FROM YOUR NECK DOWN TO YOUR TOES (washing for 3-5 minutes)  DO NOT use CHG soap on face, private areas, open wounds, or sores.  Pay special attention to the area where your surgery is being performed.  If you are having back surgery, having someone wash your back for you may be helpful. Wait 2 minutes after CHG soap is applied, then you may rinse off the CHG soap.  Pat dry with a clean towel  Put on clean clothes/pajamas   If you choose to wear lotion, please use ONLY the CHG-compatible lotions that are listed below.  Additional instructions for the day of surgery: DO NOT APPLY any lotions, deodorants, cologne, or perfumes.   Do not bring valuables to the hospital. [facility] is not responsible for any belongings/valuables. Do not wear nail polish, gel polish, artificial nails, or any other type of covering on natural nails (fingers and toes) Do not wear jewelry or makeup Put on clean/comfortable clothes.  Please brush your teeth.  Ask your nurse before applying any prescription medications to the skin.     CHG Compatible Lotions   Aveeno Moisturizing lotion  Cetaphil Moisturizing Cream  Cetaphil Moisturizing Lotion  Clairol Herbal Essence Moisturizing Lotion, Dry Skin  Clairol Herbal Essence Moisturizing Lotion, Extra Dry Skin  Clairol Herbal Essence Moisturizing Lotion, Normal Skin  Curel Age Defying Therapeutic Moisturizing Lotion with Alpha Hydroxy  Curel Extreme Care Body Lotion  Curel Soothing Hands Moisturizing Hand Lotion  Curel Therapeutic Moisturizing Cream, Fragrance-Free  Curel Therapeutic Moisturizing Lotion, Fragrance-Free  Curel Therapeutic Moisturizing Lotion, Original Formula  Eucerin  Daily Replenishing Lotion  Eucerin Dry Skin Therapy Plus Alpha Hydroxy Crme  Eucerin Dry Skin Therapy Plus Alpha Hydroxy Lotion  Eucerin Original Crme  Eucerin Original Lotion  Eucerin Plus Crme Eucerin Plus Lotion  Eucerin TriLipid Replenishing Lotion  Keri Anti-Bacterial Hand Lotion  Keri Deep Conditioning Original Lotion Dry Skin Formula Softly Scented  Keri Deep Conditioning Original Lotion, Fragrance Free Sensitive Skin Formula  Keri Lotion Fast Absorbing Fragrance Free Sensitive Skin Formula  Keri Lotion Fast Absorbing Softly Scented Dry Skin Formula  Keri Original Lotion  Keri Skin Renewal Lotion Keri Silky Smooth Lotion  Keri Silky Smooth Sensitive Skin Lotion  Nivea Body Creamy Conditioning Oil  Nivea Body Extra Enriched Teacher, adult education Moisturizing Lotion Nivea Crme  Nivea Skin Firming Lotion  NutraDerm 30 Skin Lotion  NutraDerm Skin Lotion  NutraDerm Therapeutic Skin Cream  NutraDerm Therapeutic Skin Lotion  ProShield Protective Hand Cream  Provon moisturizing lotion  Please read over the following fact sheets  that you were given.

## 2023-11-16 ENCOUNTER — Other Ambulatory Visit: Payer: Self-pay

## 2023-11-16 ENCOUNTER — Encounter (HOSPITAL_COMMUNITY): Payer: Self-pay

## 2023-11-16 ENCOUNTER — Encounter (HOSPITAL_COMMUNITY)
Admission: RE | Admit: 2023-11-16 | Discharge: 2023-11-16 | Disposition: A | Discharge status: Home / Self Care | Source: Ambulatory Visit | Attending: Neurosurgery | Admitting: Neurosurgery

## 2023-11-16 VITALS — BP 146/93 | HR 79 | Temp 98.3°F | Resp 18 | Ht 72.0 in | Wt 207.6 lb

## 2023-11-16 DIAGNOSIS — Z01818 Encounter for other preprocedural examination: Secondary | ICD-10-CM | POA: Diagnosis not present

## 2023-11-16 HISTORY — DX: Nausea with vomiting, unspecified: R11.2

## 2023-11-16 HISTORY — DX: Other specified postprocedural states: Z98.890

## 2023-11-16 LAB — BASIC METABOLIC PANEL WITH GFR
Anion gap: 8 (ref 5–15)
BUN: 16 mg/dL (ref 6–20)
CO2: 29 mmol/L (ref 22–32)
Calcium: 9.5 mg/dL (ref 8.9–10.3)
Chloride: 104 mmol/L (ref 98–111)
Creatinine, Ser: 0.99 mg/dL (ref 0.61–1.24)
GFR, Estimated: >60 mL/min (ref >60)
Glucose, Bld: 93 mg/dL (ref 70–99)
Potassium: 4.1 mmol/L (ref 3.5–5.1)
Sodium: 141 mmol/L (ref 135–145)

## 2023-11-16 LAB — CBC
HCT: 43.5 % (ref 39.0–52.0)
Hemoglobin: 14.7 g/dL (ref 13.0–17.0)
MCH: 29.2 pg (ref 26.0–34.0)
MCHC: 33.8 g/dL (ref 30.0–36.0)
MCV: 86.3 fL (ref 80.0–100.0)
Platelets: 227 10*3/uL (ref 150–400)
RBC: 5.04 MIL/uL (ref 4.22–5.81)
RDW: 12.2 % (ref 11.5–15.5)
WBC: 5.5 10*3/uL (ref 4.0–10.5)
nRBC: 0 % (ref 0.0–0.2)

## 2023-11-16 LAB — SURGICAL PCR SCREEN
MRSA, PCR: POSITIVE — AB
Staphylococcus aureus: POSITIVE — AB

## 2023-11-16 NOTE — Progress Notes (Signed)
 PCP - Barbera Setters Cardiologist - denies  PPM/ICD - denies Device Orders -  Rep Notified -   Chest x-ray - na EKG - 11/16/23 Stress Test - denies ECHO - denies Cardiac Cath - denies  Sleep Study - denies CPAP -   Fasting Blood Sugar - na Checks Blood Sugar _____ times a day  Last dose of GLP1 agonist-  na GLP1 instructions:   Blood Thinner Instructions:na Aspirin Instructions:na  ERAS Protcol -clears until 0700 PRE-SURGERY Ensure or G2- no  COVID TEST- na   Anesthesia review: no  Patient denies shortness of breath, fever, cough and chest pain at PAT appointment   All instructions explained to the patient, with a verbal understanding of the material. Patient agrees to go over the instructions while at home for a better understanding. Patient also instructed to wear a mask when out in public prior to surgery. The opportunity to ask questions was provided.

## 2023-11-23 NOTE — Anesthesia Preprocedure Evaluation (Signed)
 Anesthesia Evaluation  Patient identified by MRN, date of birth, ID band Patient awake    Reviewed: Allergy & Precautions, H&P , NPO status , Patient's Chart, lab work & pertinent test results  History of Anesthesia Complications (+) PONV and history of anesthetic complications  Airway Mallampati: II  TM Distance: >3 FB Neck ROM: Full    Dental no notable dental hx. (+) Dental Advisory Given   Pulmonary neg pulmonary ROS   Pulmonary exam normal breath sounds clear to auscultation       Cardiovascular hypertension, Normal cardiovascular exam Rhythm:Regular Rate:Normal     Neuro/Psych Displacement of lumbosacral intervertebral disc  Neuromuscular disease  negative psych ROS   GI/Hepatic Neg liver ROS,GERD  ,,  Endo/Other  negative endocrine ROS    Renal/GU negative Renal ROS  negative genitourinary   Musculoskeletal  (+) Arthritis ,    Abdominal   Peds negative pediatric ROS (+)  Hematology negative hematology ROS (+)   Anesthesia Other Findings   Reproductive/Obstetrics negative OB ROS                             Anesthesia Physical Anesthesia Plan  ASA: 2  Anesthesia Plan: General   Post-op Pain Management: Tylenol PO (pre-op)*   Induction: Intravenous  PONV Risk Score and Plan: 3 and Ondansetron, Dexamethasone, Midazolam and Scopolamine patch - Pre-op  Airway Management Planned: Oral ETT  Additional Equipment:   Intra-op Plan:   Post-operative Plan: Extubation in OR  Informed Consent: I have reviewed the patients History and Physical, chart, labs and discussed the procedure including the risks, benefits and alternatives for the proposed anesthesia with the patient or authorized representative who has indicated his/her understanding and acceptance.     Dental advisory given  Plan Discussed with: CRNA  Anesthesia Plan Comments:        Anesthesia Quick  Evaluation

## 2023-11-24 ENCOUNTER — Ambulatory Visit (HOSPITAL_COMMUNITY): Payer: Self-pay | Admitting: Physician Assistant

## 2023-11-24 ENCOUNTER — Ambulatory Visit (HOSPITAL_COMMUNITY)
Admission: RE | Admit: 2023-11-24 | Discharge: 2023-11-24 | Disposition: A | Discharge status: Home / Self Care | Attending: Neurosurgery | Admitting: Neurosurgery

## 2023-11-24 ENCOUNTER — Ambulatory Visit (HOSPITAL_COMMUNITY)

## 2023-11-24 ENCOUNTER — Other Ambulatory Visit: Payer: Self-pay

## 2023-11-24 ENCOUNTER — Encounter (HOSPITAL_COMMUNITY): Payer: Self-pay | Admitting: Neurosurgery

## 2023-11-24 ENCOUNTER — Encounter (HOSPITAL_COMMUNITY)
Admission: RE | Disposition: A | Discharge status: Home / Self Care | Payer: Self-pay | Source: Home / Self Care | Attending: Neurosurgery

## 2023-11-24 ENCOUNTER — Ambulatory Visit (HOSPITAL_COMMUNITY): Payer: Self-pay | Admitting: Anesthesiology

## 2023-11-24 DIAGNOSIS — M5127 Other intervertebral disc displacement, lumbosacral region: Secondary | ICD-10-CM | POA: Insufficient documentation

## 2023-11-24 DIAGNOSIS — Z981 Arthrodesis status: Secondary | ICD-10-CM | POA: Diagnosis not present

## 2023-11-24 HISTORY — PX: LUMBAR LAMINECTOMY/DECOMPRESSION MICRODISCECTOMY: SHX5026

## 2023-11-24 SURGERY — LUMBAR LAMINECTOMY/DECOMPRESSION MICRODISCECTOMY 1 LEVEL
Anesthesia: General | Laterality: Left

## 2023-11-24 MED ORDER — DEXMEDETOMIDINE HCL IN NACL 80 MCG/20ML IV SOLN
INTRAVENOUS | Status: DC | PRN
  Administered 2023-11-24: 4 ug via INTRAVENOUS
  Administered 2023-11-24: 8 ug via INTRAVENOUS

## 2023-11-24 MED ORDER — LACTATED RINGERS IV SOLN: INTRAVENOUS | Status: DC

## 2023-11-24 MED ORDER — HYDROCODONE-ACETAMINOPHEN 5-325 MG PO TABS: 1.0000 | ORAL_TABLET | Freq: Four times a day (QID) | ORAL | 0 refills | Status: DC | PRN

## 2023-11-24 MED ORDER — VANCOMYCIN HCL 1500 MG/300ML IV SOLN
1500.0000 mg | Freq: Once | INTRAVENOUS | Status: AC
  Administered 2023-11-24: 1500 mg via INTRAVENOUS
  Filled 2023-11-24: qty 300

## 2023-11-24 MED ORDER — CHLORHEXIDINE GLUCONATE 0.12 % MT SOLN
15.0000 mL | Freq: Once | OROMUCOSAL | Status: AC
  Administered 2023-11-24: 15 mL via OROMUCOSAL
  Filled 2023-11-24: qty 15

## 2023-11-24 MED ORDER — OXYCODONE HCL 5 MG/5ML PO SOLN: 5.0000 mg | Freq: Once | ORAL | Status: AC | PRN

## 2023-11-24 MED ORDER — PROPOFOL 10 MG/ML IV BOLUS
INTRAVENOUS | Status: DC | PRN
Start: 2023-11-24 — End: 2023-11-24
  Administered 2023-11-24: 200 mg via INTRAVENOUS

## 2023-11-24 MED ORDER — PROPOFOL 10 MG/ML IV BOLUS
INTRAVENOUS | Status: AC
  Filled 2023-11-24: qty 20

## 2023-11-24 MED ORDER — CEFAZOLIN SODIUM-DEXTROSE 2-4 GM/100ML-% IV SOLN
2.0000 g | INTRAVENOUS | Status: AC
  Administered 2023-11-24: 2 g via INTRAVENOUS
  Filled 2023-11-24: qty 100

## 2023-11-24 MED ORDER — KETAMINE HCL 50 MG/5ML IJ SOSY
PREFILLED_SYRINGE | INTRAMUSCULAR | Status: AC
  Filled 2023-11-24: qty 5

## 2023-11-24 MED ORDER — LIDOCAINE 2% (20 MG/ML) 5 ML SYRINGE
INTRAMUSCULAR | Status: DC | PRN
  Administered 2023-11-24: 100 mg via INTRAVENOUS

## 2023-11-24 MED ORDER — CHLORHEXIDINE GLUCONATE CLOTH 2 % EX PADS: 6.0000 | MEDICATED_PAD | Freq: Once | CUTANEOUS | Status: DC

## 2023-11-24 MED ORDER — SUGAMMADEX SODIUM 200 MG/2ML IV SOLN
INTRAVENOUS | Status: DC | PRN
  Administered 2023-11-24: 200 mg via INTRAVENOUS

## 2023-11-24 MED ORDER — ONDANSETRON HCL 4 MG/2ML IJ SOLN
INTRAMUSCULAR | Status: AC
  Filled 2023-11-24: qty 2

## 2023-11-24 MED ORDER — DEXAMETHASONE SODIUM PHOSPHATE 10 MG/ML IJ SOLN
INTRAMUSCULAR | Status: DC | PRN
  Administered 2023-11-24: 10 mg via INTRAVENOUS

## 2023-11-24 MED ORDER — LIDOCAINE-EPINEPHRINE 0.5 %-1:200000 IJ SOLN
INTRAMUSCULAR | Status: AC
  Filled 2023-11-24: qty 50

## 2023-11-24 MED ORDER — BUPIVACAINE HCL (PF) 0.5 % IJ SOLN
INTRAMUSCULAR | Status: DC | PRN
  Administered 2023-11-24: 20 mL

## 2023-11-24 MED ORDER — BUPIVACAINE HCL (PF) 0.5 % IJ SOLN
INTRAMUSCULAR | Status: AC
  Filled 2023-11-24: qty 30

## 2023-11-24 MED ORDER — ROCURONIUM BROMIDE 10 MG/ML (PF) SYRINGE
PREFILLED_SYRINGE | INTRAVENOUS | Status: AC
  Filled 2023-11-24: qty 10

## 2023-11-24 MED ORDER — ROCURONIUM BROMIDE 10 MG/ML (PF) SYRINGE
PREFILLED_SYRINGE | INTRAVENOUS | Status: DC | PRN
  Administered 2023-11-24: 70 mg via INTRAVENOUS
  Administered 2023-11-24: 10 mg via INTRAVENOUS

## 2023-11-24 MED ORDER — DEXAMETHASONE SODIUM PHOSPHATE 10 MG/ML IJ SOLN
INTRAMUSCULAR | Status: AC
  Filled 2023-11-24: qty 1

## 2023-11-24 MED ORDER — TIZANIDINE HCL 4 MG PO TABS: 4.0000 mg | ORAL_TABLET | Freq: Four times a day (QID) | ORAL | 0 refills | Status: DC | PRN

## 2023-11-24 MED ORDER — OXYCODONE HCL 5 MG PO TABS
ORAL_TABLET | ORAL | Status: AC
  Filled 2023-11-24: qty 1

## 2023-11-24 MED ORDER — ACETAMINOPHEN 10 MG/ML IV SOLN: 1000.0000 mg | Freq: Once | INTRAVENOUS | Status: DC | PRN

## 2023-11-24 MED ORDER — LIDOCAINE 2% (20 MG/ML) 5 ML SYRINGE
INTRAMUSCULAR | Status: AC
  Filled 2023-11-24: qty 5

## 2023-11-24 MED ORDER — THROMBIN 5000 UNITS EX KIT
PACK | CUTANEOUS | Status: AC
  Filled 2023-11-24: qty 2

## 2023-11-24 MED ORDER — MIDAZOLAM HCL 2 MG/2ML IJ SOLN
INTRAMUSCULAR | Status: AC
  Filled 2023-11-24: qty 2

## 2023-11-24 MED ORDER — 0.9 % SODIUM CHLORIDE (POUR BTL) OPTIME
TOPICAL | Status: DC | PRN
  Administered 2023-11-24: 1000 mL

## 2023-11-24 MED ORDER — FENTANYL CITRATE (PF) 250 MCG/5ML IJ SOLN
INTRAMUSCULAR | Status: DC | PRN
  Administered 2023-11-24: 100 ug via INTRAVENOUS

## 2023-11-24 MED ORDER — KETAMINE HCL 50 MG/5ML IJ SOSY
PREFILLED_SYRINGE | INTRAMUSCULAR | Status: DC | PRN
  Administered 2023-11-24: 10 mg via INTRAVENOUS
  Administered 2023-11-24: 20 mg via INTRAVENOUS

## 2023-11-24 MED ORDER — DROPERIDOL 2.5 MG/ML IJ SOLN: 0.6250 mg | Freq: Once | INTRAMUSCULAR | Status: DC | PRN

## 2023-11-24 MED ORDER — THROMBIN (RECOMBINANT) 5000 UNITS EX SOLR
CUTANEOUS | Status: DC | PRN
  Administered 2023-11-24: 10 mL via TOPICAL

## 2023-11-24 MED ORDER — MIDAZOLAM HCL 2 MG/2ML IJ SOLN
INTRAMUSCULAR | Status: DC | PRN
  Administered 2023-11-24: 2 mg via INTRAVENOUS

## 2023-11-24 MED ORDER — SCOPOLAMINE 1 MG/3DAYS TD PT72
1.0000 | MEDICATED_PATCH | TRANSDERMAL | Status: DC
  Administered 2023-11-24: 1.5 mg via TRANSDERMAL
  Filled 2023-11-24: qty 1

## 2023-11-24 MED ORDER — KETOROLAC TROMETHAMINE 30 MG/ML IJ SOLN
INTRAMUSCULAR | Status: DC | PRN
Start: 2023-11-24 — End: 2023-11-24
  Administered 2023-11-24: 30 mg via INTRAVENOUS

## 2023-11-24 MED ORDER — ORAL CARE MOUTH RINSE: 15.0000 mL | Freq: Once | OROMUCOSAL | Status: AC

## 2023-11-24 MED ORDER — FENTANYL CITRATE (PF) 100 MCG/2ML IJ SOLN: 25.0000 ug | INTRAMUSCULAR | Status: DC | PRN

## 2023-11-24 MED ORDER — ONDANSETRON HCL 4 MG/2ML IJ SOLN
INTRAMUSCULAR | Status: DC | PRN
  Administered 2023-11-24: 4 mg via INTRAVENOUS

## 2023-11-24 MED ORDER — OXYCODONE HCL 5 MG PO TABS
5.0000 mg | ORAL_TABLET | Freq: Once | ORAL | Status: AC | PRN
  Administered 2023-11-24: 5 mg via ORAL

## 2023-11-24 MED ORDER — LIDOCAINE-EPINEPHRINE 0.5 %-1:200000 IJ SOLN
INTRAMUSCULAR | Status: DC | PRN
  Administered 2023-11-24: 10 mL

## 2023-11-24 MED ORDER — ACETAMINOPHEN 500 MG PO TABS
1000.0000 mg | ORAL_TABLET | Freq: Once | ORAL | Status: AC
  Administered 2023-11-24: 1000 mg via ORAL
  Filled 2023-11-24: qty 2

## 2023-11-24 MED ORDER — FENTANYL CITRATE (PF) 250 MCG/5ML IJ SOLN
INTRAMUSCULAR | Status: AC
  Filled 2023-11-24: qty 5

## 2023-11-24 SURGICAL SUPPLY — 39 items
BAG COUNTER SPONGE SURGICOUNT (BAG) ×1 IMPLANT
BENZOIN TINCTURE PRP APPL 2/3 (GAUZE/BANDAGES/DRESSINGS) IMPLANT
BLADE CLIPPER SURG (BLADE) IMPLANT
BUR MATCHSTICK NEURO 3.0 LAGG (BURR) ×1 IMPLANT
BUR PRECISION FLUTE 5.0 (BURR) IMPLANT
CANISTER SUCT 3000ML PPV (MISCELLANEOUS) ×1 IMPLANT
DERMABOND ADVANCED .7 DNX12 (GAUZE/BANDAGES/DRESSINGS) ×1 IMPLANT
DRAPE LAPAROTOMY 100X72X124 (DRAPES) ×1 IMPLANT
DRAPE MICROSCOPE SLANT 54X150 (MISCELLANEOUS) ×1 IMPLANT
DRAPE SURG 17X23 STRL (DRAPES) ×1 IMPLANT
DURAPREP 26ML APPLICATOR (WOUND CARE) ×1 IMPLANT
ELECT REM PT RETURN 9FT ADLT (ELECTROSURGICAL) ×1 IMPLANT
ELECTRODE REM PT RTRN 9FT ADLT (ELECTROSURGICAL) ×1 IMPLANT
GAUZE 4X4 16PLY ~~LOC~~+RFID DBL (SPONGE) IMPLANT
GAUZE SPONGE 4X4 12PLY STRL (GAUZE/BANDAGES/DRESSINGS) IMPLANT
GLOVE ECLIPSE 6.5 STRL STRAW (GLOVE) ×1 IMPLANT
GLOVE EXAM NITRILE XL STR (GLOVE) IMPLANT
GOWN STRL REUS W/ TWL LRG LVL3 (GOWN DISPOSABLE) ×2 IMPLANT
GOWN STRL REUS W/ TWL XL LVL3 (GOWN DISPOSABLE) IMPLANT
GOWN STRL REUS W/TWL 2XL LVL3 (GOWN DISPOSABLE) IMPLANT
KIT BASIN OR (CUSTOM PROCEDURE TRAY) ×1 IMPLANT
KIT TURNOVER KIT B (KITS) ×1 IMPLANT
NDL HYPO 25X1 1.5 SAFETY (NEEDLE) ×1 IMPLANT
NDL SPNL 18GX3.5 QUINCKE PK (NEEDLE) IMPLANT
NEEDLE HYPO 25X1 1.5 SAFETY (NEEDLE) ×1 IMPLANT
NEEDLE SPNL 18GX3.5 QUINCKE PK (NEEDLE) IMPLANT
NS IRRIG 1000ML POUR BTL (IV SOLUTION) ×1 IMPLANT
PACK LAMINECTOMY NEURO (CUSTOM PROCEDURE TRAY) ×1 IMPLANT
PAD ARMBOARD POSITIONER FOAM (MISCELLANEOUS) ×3 IMPLANT
SPIKE FLUID TRANSFER (MISCELLANEOUS) ×1 IMPLANT
SPONGE SURGIFOAM ABS GEL SZ50 (HEMOSTASIS) ×1 IMPLANT
SPONGE T-LAP 4X18 ~~LOC~~+RFID (SPONGE) IMPLANT
STRIP CLOSURE SKIN 1/2X4 (GAUZE/BANDAGES/DRESSINGS) IMPLANT
SUT VIC AB 0 CT1 18XCR BRD8 (SUTURE) ×1 IMPLANT
SUT VIC AB 2-0 CT1 18 (SUTURE) ×1 IMPLANT
SUT VIC AB 3-0 SH 8-18 (SUTURE) ×1 IMPLANT
TOWEL GREEN STERILE (TOWEL DISPOSABLE) ×1 IMPLANT
TOWEL GREEN STERILE FF (TOWEL DISPOSABLE) ×1 IMPLANT
WATER STERILE IRR 1000ML POUR (IV SOLUTION) ×1 IMPLANT

## 2023-11-24 NOTE — Anesthesia Postprocedure Evaluation (Signed)
 Anesthesia Post Note  Patient: Dennis Blanchard  Procedure(s) Performed: LUMBAR LAMINECTOMY/DECOMPRESSION MICRODISCECTOMY 1 LEVEL (Left)     Patient location during evaluation: PACU Anesthesia Type: General Level of consciousness: awake and alert Pain management: pain level controlled Vital Signs Assessment: post-procedure vital signs reviewed and stable Respiratory status: spontaneous breathing, nonlabored ventilation, respiratory function stable and patient connected to nasal cannula oxygen Cardiovascular status: blood pressure returned to baseline and stable Postop Assessment: no apparent nausea or vomiting Anesthetic complications: no   No notable events documented.  Last Vitals:  Vitals:   11/24/23 1045 11/24/23 1100  BP: 127/89 129/89  Pulse: (!) 58 60  Resp: 12 11  Temp:    SpO2: 100% 99%    Last Pain:  Vitals:   11/24/23 1100  TempSrc:   PainSc: 3                  [place]

## 2023-11-24 NOTE — H&P (Signed)
 BP (!) 136/94   Pulse 70   Temp 98.2 F (36.8 C) (Oral)   Resp 18   Ht 6' (1.829 m)   Wt 91.2 kg   SpO2 100%   BMI 27.26 kg/m   Mr. Dennis Blanchard returned with an MRI of the lumbar spine.  It shows a herniated disc on the left side at L5-S1.  He had a previous laminectomy at L4-5 on the left side.  He is no able to sit down today and he cannot stand still.  He has to shift constantly. He wakes up miserable, he says. He is anxious.  He has had 2 rounds of Oral steroids without relief.  He has been hurting since December.  His mornings have gotten worse where he was not taking tramadol now.  He says his alarm 90 minutes prior to waking up, so he can take a tramadol so he can get his 2 kids to school.  Mr. Dennis Blanchard states he will give me a call.  I do think a discectomy is probably his best and most effective form of treatment.  We are well below the cauda equina, paralysis is not a possibility.  I think he would do well.  Mr. Dennis Blanchard returned with an MRI of the lumbar spine.  It shows a herniated disc on the left side at L5-S1.  He had a previous laminectomy at L4-5 on the left side.  He is no able to sit down today and he cannot stand still.  He has to shift constantly. He wakes up miserable, he says. He is anxious.  He has had 2 rounds of Oral steroids without relief.  He has been hurting since December.  His mornings have gotten worse where he was not taking tramadol now.  He says his alarm 90 minutes prior to waking up, so he can take a tramadol so he can get his 2 kids to school.  Mr. Dennis Blanchard states he will give me a call.  I do think a discectomy is probably his best and most effective form of treatment.  We are well below the cauda equina, paralysis is not a possibility.

## 2023-11-24 NOTE — Op Note (Signed)
 11/24/2023  10:16 AM  PATIENT:  Dennis Blanchard  43 y.o. 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 was taken to the operating room, intubated and placed under a general anesthetic without difficulty. He was positioned prone on a Wilson frame with all pressure points padded. His back was prepped and draped in a sterile manner. I opened the skin with a 10 blade and carried the dissection down to the thoracolumbar fascia. I used both sharp dissection and the monopolar cautery to expose the lamina of L5, and S1. I confirmed my location with an intraoperative xray.  I used the drill, Kerrison punches, and curettes to perform a semihemilaminectomy of L5. I used the punches to remove the ligamentum flavum to expose the thecal sac. I brought the microscope into the operative field and started the decompression of the spinal canal, thecal sac and S1 root(s). I cauterized epidural veins overlying the disc space then divided them sharply. I opened the disc space with a 15 blade and proceeded with the discectomy. I used pituitary rongeurs, curettes, and other instruments to remove disc material. After the discectomy was completed I inspected the S1 nerve root and felt it was well decompressed. I explored rostrally, laterally, medially, and caudally and was satisfied with the decompression.NP Bergman and I irrigated the wound, then closed in layers.  We approximated the thoracolumbar fascia, subcutaneous, and subcuticular planes with vicryl sutures. I used dermabond for a sterile dressing.   PLAN OF CARE: Discharge to home after PACU  PATIENT DISPOSITION:  PACU - hemodynamically stable.   Delay start of Pharmacological VTE agent (>24hrs) due to surgical blood loss or risk of bleeding:  yes

## 2023-11-24 NOTE — Anesthesia Procedure Notes (Signed)
 Procedure Name: Intubation Date/Time: 11/24/2023 8:22 AM  Performed by: Susy Manor, CRNAPre-anesthesia Checklist: Patient identified, Emergency Drugs available, Suction available and Patient being monitored Patient Re-evaluated:Patient Re-evaluated prior to induction Oxygen Delivery Method: Circle System Utilized Preoxygenation: Pre-oxygenation with 100% oxygen Induction Type: IV induction Ventilation: Mask ventilation without difficulty Laryngoscope Size: Mac and 4 Grade View: Grade III Tube type: Oral Tube size: 7.5 mm Number of attempts: 1 Airway Equipment and Method: Stylet and Oral airway Placement Confirmation: ETT inserted through vocal cords under direct vision, positive ETCO2 and breath sounds checked- equal and bilateral Secured at: 22 cm Tube secured with: Tape Dental Injury: Teeth and Oropharynx as per pre-operative assessment

## 2023-11-24 NOTE — Transfer of Care (Signed)
 Immediate Anesthesia Transfer of Care Note  Patient: Dennis Blanchard  Procedure(s) Performed: LUMBAR LAMINECTOMY/DECOMPRESSION MICRODISCECTOMY 1 LEVEL (Left)  Patient Location: PACU  Anesthesia Type:General  Level of Consciousness: awake, alert , and oriented  Airway & Oxygen Therapy: Patient Spontanous Breathing and Patient connected to nasal cannula oxygen  Post-op Assessment: Report given to RN and Post -op Vital signs reviewed and stable  Post vital signs: Reviewed and stable  Last Vitals:  Vitals Value Taken Time  BP 120/87 11/24/23 1015  Temp    Pulse 66 11/24/23 1017  Resp 15 11/24/23 1017  SpO2 98 % 11/24/23 1017  Vitals shown include unfiled device data.  Last Pain:  Vitals:   11/24/23 1610  TempSrc:   PainSc: 5       Patients Stated Pain Goal: 2 (11/24/23 9604)  Complications: No notable events documented.

## 2023-11-24 NOTE — Discharge Instructions (Signed)
 Lumbar Discectomy Care After A discectomy involves removal of discmaterial (the cartilage-like structures located between the bones of the back). It is done to relieve pressure on nerve roots. It can be used as a treatment for a back problem. The time in surgery depends on the findings in surgery and what is necessary to correct the problems. HOME CARE INSTRUCTIONS   Check the cut (incision) made by the surgeon twice a day for signs of infection. Some signs of infection may include:   A foul smelling, greenish or yellowish discharge from the wound.   Increased pain.   Increased redness over the incision (operative) site.   The skin edges may separate.   Flu-like symptoms (problems).   A temperature above 101.5 F (38.6 C).   Change your bandages in about 24 to 36 hours following surgery or as directed.   You may shower tomrrow.  Avoid bathtubs, swimming pools and hot tubs for three weeks or until your incision has healed completely.  Follow your doctor's instructions as to safe activities, exercises, and physical therapy.   Weight reduction may be beneficial if you are overweight.   Daily exercise is helpful to prevent the return of problems. Walking is permitted. You may use a treadmill without an incline. Cut down on activities and exercise if you have discomfort. You may also go up and down stairs as much as you can tolerate.   DO NOT lift anything heavier than 10 to 15 lbs. Avoid bending or twisting at the waist. Always bend your knees when lifting.   Maintain strength and range of motion as instructed.   Do not drive for 10 days, or as directed by your doctors. You may be a passenger . Lying back in the passenger seat may be more comfortable for you. Always wear a seatbelt.   Limit your sitting in a regular chair to 20 to 30 minutes at a time. There are no limitations for sitting in a recliner. You should lie down or walk in between sitting periods.   Only take  over-the-counter or prescription medicines for pain, discomfort, or fever as directed by your caregiver.  SEEK MEDICAL CARE IF:   There is increased bleeding (more than a small spot) from the wound.   You notice redness, swelling, or increasing pain in the wound.   Pus is coming from wound.   You develop an unexplained oral temperature above 102 F (38.9 C) develops.   You notice a foul smell coming from the wound or dressing.   You have increasing pain in your wound.  SEEK IMMEDIATE MEDICAL CARE IF:   You develop a rash.   You have difficulty breathing.   You develop any allergic problems to medicines given.  Document Released: 07/06/2004 Document Revised: 07/21/2011 Document Reviewed: 10/25/2007 Exit[employer] Patient Information

## 2023-11-25 ENCOUNTER — Encounter (HOSPITAL_COMMUNITY): Payer: Self-pay | Admitting: Neurosurgery

## 2023-11-27 ENCOUNTER — Other Ambulatory Visit: Payer: Self-pay

## 2023-11-27 ENCOUNTER — Observation Stay (HOSPITAL_COMMUNITY)
Admission: EM | Admit: 2023-11-27 | Discharge: 2023-11-29 | Disposition: A | Discharge status: Home / Self Care | Attending: Emergency Medicine | Admitting: Emergency Medicine

## 2023-11-27 ENCOUNTER — Encounter (HOSPITAL_COMMUNITY): Payer: Self-pay

## 2023-11-27 DIAGNOSIS — K219 Gastro-esophageal reflux disease without esophagitis: Secondary | ICD-10-CM | POA: Diagnosis not present

## 2023-11-27 DIAGNOSIS — R7401 Elevation of levels of liver transaminase levels: Secondary | ICD-10-CM | POA: Insufficient documentation

## 2023-11-27 DIAGNOSIS — Z1152 Encounter for screening for COVID-19: Secondary | ICD-10-CM | POA: Insufficient documentation

## 2023-11-27 DIAGNOSIS — Z85828 Personal history of other malignant neoplasm of skin: Secondary | ICD-10-CM | POA: Diagnosis not present

## 2023-11-27 DIAGNOSIS — R918 Other nonspecific abnormal finding of lung field: Secondary | ICD-10-CM | POA: Diagnosis not present

## 2023-11-27 DIAGNOSIS — R509 Fever, unspecified: Secondary | ICD-10-CM | POA: Insufficient documentation

## 2023-11-27 DIAGNOSIS — R5383 Other fatigue: Secondary | ICD-10-CM | POA: Diagnosis not present

## 2023-11-27 DIAGNOSIS — Z79899 Other long term (current) drug therapy: Secondary | ICD-10-CM | POA: Diagnosis not present

## 2023-11-27 DIAGNOSIS — Z9889 Other specified postprocedural states: Secondary | ICD-10-CM

## 2023-11-27 DIAGNOSIS — I10 Essential (primary) hypertension: Secondary | ICD-10-CM | POA: Diagnosis not present

## 2023-11-27 DIAGNOSIS — R001 Bradycardia, unspecified: Secondary | ICD-10-CM | POA: Diagnosis not present

## 2023-11-27 DIAGNOSIS — R55 Syncope and collapse: Secondary | ICD-10-CM | POA: Diagnosis not present

## 2023-11-27 DIAGNOSIS — R5082 Postprocedural fever: Secondary | ICD-10-CM | POA: Diagnosis not present

## 2023-11-27 MED ORDER — SODIUM CHLORIDE 0.9 % IV BOLUS
1000.0000 mL | Freq: Once | INTRAVENOUS | Status: AC
Start: 2023-11-28 — End: 2023-11-28
  Administered 2023-11-28: 1000 mL via INTRAVENOUS

## 2023-11-27 NOTE — ED Provider Notes (Signed)
 [place] EMERGENCY DEPARTMENT AT [facility] Provider Note   CSN: [id number] Arrival date & time: 11/27/23  2321     History {Add pertinent medical, surgical, social history, OB history to HPI:1} Chief Complaint  Patient presents with   Loss of Consciousness    Dennis Blanchard is a 43 y.o. male, history of hypertension, hyperlipidemia, who presents to the ED secondary to a syncopal episode that occurred today, around 9:00.  Notes that he has been feeling off all day, has developed a fever, after having a lumbar surgery on 4/11, by Dr. Franky Macho, fever was 101.5 today, and his wife states that she thought about calling the neurosurgery group, but held off.  Patient continued to feel unwell today, states he felt a little bit lightheaded, around 4 PM, with some nausea, but it resolved with sitting.  He states that he had an episode again today around 9 PM, where he sat down, and his wife states that she he was not responding to her voice for about a minute, and just looked pale and ashen.  She was able to catch him, and sit him up appropriately.  But called EMS shortly.  He denies any kind of abdominal pain, vomiting, sore throat, cough.  Does state that he has a mild headache.  Notes that the site has been healing well, wife has been monitoring this.  Has had the fever for the last day.  Previously after that did not have fevers.  Wife states he has been active, and walking appropriately.  Is not on any anticoagulants and denies any chest pain or shortness of breath.  Home Medications Prior to Admission medications   Medication Sig Start Date End Date Taking? Authorizing Provider  cetirizine (ZYRTEC) 10 MG tablet Take 10 mg by mouth daily.    [provider]  cyclobenzaprine (FLEXERIL) 5 MG tablet Take 5 mg by mouth 3 (three) times daily.    [provider]  famotidine (PEPCID) 20 MG tablet Take 1 tablet (20 mg total) by mouth at bedtime. 09/25/23   May, Deanna J, NP   gabapentin (NEURONTIN) 300 MG capsule Take 300 mg by mouth 3 (three) times daily. 08/30/19   [provider]  HYDROcodone-acetaminophen (NORCO/VICODIN) 5-325 MG tablet Take 1 tablet by mouth every 6 (six) hours as needed for up to 7 days for moderate pain (pain score 4-6). 11/24/23 12/01/23  Coletta Memos, MD  Multiple Vitamin (MULTIVITAMIN WITH MINERALS) TABS tablet Take 1 tablet by mouth daily.    [provider]  omeprazole (PRILOSEC) 40 MG capsule Take 1 capsule (40 mg total) by mouth daily. Patient taking differently: Take 40 mg by mouth in the morning and at bedtime. 10/27/23   Imogene Burn, MD  rosuvastatin (CRESTOR) 10 MG tablet Take 10 mg by mouth at bedtime. 09/12/19   [provider]  tiZANidine (ZANAFLEX) 4 MG tablet Take 1 tablet (4 mg total) by mouth every 6 (six) hours as needed for muscle spasms. 11/24/23   Coletta Memos, MD  traMADol (ULTRAM) 50 MG tablet Take 50 mg by mouth 2 (two) times daily. 10/24/23   [provider]      Allergies    Amlodipine and Lisinopril    Review of Systems   Review of Systems  Constitutional:  Positive for fever.  Respiratory:  Negative for cough.   Neurological:  Positive for syncope.    Physical Exam Updated Vital Signs BP 122/77   Pulse 88   Resp 17  Ht 6' (1.829 m)   Wt 91.2 kg   SpO2 99%   BMI 27.26 kg/m  Physical Exam Vitals and nursing note reviewed.  Constitutional:      General: He is not in acute distress.    Appearance: He is well-developed.  HENT:     Head: Normocephalic and atraumatic.  Eyes:     Conjunctiva/sclera: Conjunctivae normal.  Cardiovascular:     Rate and Rhythm: Normal rate and regular rhythm.     Heart sounds: No murmur heard. Pulmonary:     Effort: Pulmonary effort is normal. No respiratory distress.     Breath sounds: Normal breath sounds.  Abdominal:     Palpations: Abdomen is soft.     Tenderness: There is no abdominal tenderness.  Musculoskeletal:         General: No swelling.     Cervical back: Neck supple.  Skin:    General: Skin is warm and dry.     Capillary Refill: Capillary refill takes less than 2 seconds.     Comments: Healing incision, to lumbar spine, with Dermabond.  No evidence of any erythema, or warmth.  Neurological:     Mental Status: He is alert.  Psychiatric:        Mood and Affect: Mood normal.     ED Results / Procedures / Treatments   Labs (all labs ordered are listed, but only abnormal results are displayed) Labs Reviewed - No data to display  EKG None  Radiology No results found.  Procedures Procedures  {Document cardiac monitor, telemetry assessment procedure when appropriate:1}  Medications Ordered in ED Medications - No data to display  ED Course/ Medical Decision Making/ A&P   {   Click here for ABCD2, HEART and other calculatorsREFRESH Note before signing :1}                              Medical Decision Making Amount and/or Complexity of Data Reviewed Labs: ordered. Radiology: ordered.   ***  {Document critical care time when appropriate:1} {Document review of labs and clinical decision tools ie heart score, Chads2Vasc2 etc:1}  {Document your independent review of radiology images, and any outside records:1} {Document your discussion with family members, caretakers, and with consultants:1} {Document social determinants of health affecting pt's care:1} {Document your decision making why or why not admission, treatments were needed:1} Final Clinical Impression(s) / ED Diagnoses Final diagnoses:  None    Rx / DC Orders ED Discharge Orders     None

## 2023-11-27 NOTE — ED Triage Notes (Signed)
 Pt BIB [facility] from home after syncopal episode tonight. Back surgery this past Friday. Pt reports fever today tmax 101.3.  NS 110/70 81HR 98% 170 cbg

## 2023-11-28 ENCOUNTER — Encounter (HOSPITAL_COMMUNITY): Payer: Self-pay | Admitting: Internal Medicine

## 2023-11-28 ENCOUNTER — Observation Stay (HOSPITAL_COMMUNITY)

## 2023-11-28 ENCOUNTER — Emergency Department (HOSPITAL_COMMUNITY)

## 2023-11-28 ENCOUNTER — Observation Stay (HOSPITAL_BASED_OUTPATIENT_CLINIC_OR_DEPARTMENT_OTHER)

## 2023-11-28 DIAGNOSIS — R55 Syncope and collapse: Secondary | ICD-10-CM

## 2023-11-28 DIAGNOSIS — M48061 Spinal stenosis, lumbar region without neurogenic claudication: Secondary | ICD-10-CM | POA: Diagnosis not present

## 2023-11-28 DIAGNOSIS — R509 Fever, unspecified: Secondary | ICD-10-CM | POA: Diagnosis not present

## 2023-11-28 DIAGNOSIS — M5127 Other intervertebral disc displacement, lumbosacral region: Secondary | ICD-10-CM | POA: Diagnosis not present

## 2023-11-28 DIAGNOSIS — M5137 Other intervertebral disc degeneration, lumbosacral region with discogenic back pain only: Secondary | ICD-10-CM | POA: Diagnosis not present

## 2023-11-28 DIAGNOSIS — R5082 Postprocedural fever: Secondary | ICD-10-CM | POA: Diagnosis not present

## 2023-11-28 LAB — URINALYSIS, ROUTINE W REFLEX MICROSCOPIC
Bilirubin Urine: NEGATIVE
Glucose, UA: NEGATIVE mg/dL
Hgb urine dipstick: NEGATIVE
Ketones, ur: NEGATIVE mg/dL
Leukocytes,Ua: NEGATIVE
Nitrite: NEGATIVE
Protein, ur: NEGATIVE mg/dL
Specific Gravity, Urine: 1.023 (ref 1.005–1.030)
pH: 7 (ref 5.0–8.0)

## 2023-11-28 LAB — COMPREHENSIVE METABOLIC PANEL WITH GFR
ALT: 68 U/L — ABNORMAL HIGH (ref 0–44)
ALT: 77 U/L — ABNORMAL HIGH (ref 0–44)
AST: 32 U/L (ref 15–41)
AST: 44 U/L — ABNORMAL HIGH (ref 15–41)
Albumin: 3.2 g/dL — ABNORMAL LOW (ref 3.5–5.0)
Albumin: 3.4 g/dL — ABNORMAL LOW (ref 3.5–5.0)
Alkaline Phosphatase: 81 U/L (ref 38–126)
Alkaline Phosphatase: 90 U/L (ref 38–126)
Anion gap: 10 (ref 5–15)
Anion gap: 8 (ref 5–15)
BUN: 8 mg/dL (ref 6–20)
BUN: 9 mg/dL (ref 6–20)
CO2: 23 mmol/L (ref 22–32)
CO2: 24 mmol/L (ref 22–32)
Calcium: 8.5 mg/dL — ABNORMAL LOW (ref 8.9–10.3)
Calcium: 8.8 mg/dL — ABNORMAL LOW (ref 8.9–10.3)
Chloride: 103 mmol/L (ref 98–111)
Chloride: 106 mmol/L (ref 98–111)
Creatinine, Ser: 0.91 mg/dL (ref 0.61–1.24)
Creatinine, Ser: 1.04 mg/dL (ref 0.61–1.24)
GFR, Estimated: >60 mL/min (ref >60)
GFR, Estimated: >60 mL/min (ref >60)
Glucose, Bld: 111 mg/dL — ABNORMAL HIGH (ref 70–99)
Glucose, Bld: 144 mg/dL — ABNORMAL HIGH (ref 70–99)
Potassium: 3.8 mmol/L (ref 3.5–5.1)
Potassium: 4 mmol/L (ref 3.5–5.1)
Sodium: 135 mmol/L (ref 135–145)
Sodium: 139 mmol/L (ref 135–145)
Total Bilirubin: 0.8 mg/dL (ref 0.0–1.2)
Total Bilirubin: 0.8 mg/dL (ref 0.0–1.2)
Total Protein: 6 g/dL — ABNORMAL LOW (ref 6.5–8.1)
Total Protein: 6.1 g/dL — ABNORMAL LOW (ref 6.5–8.1)

## 2023-11-28 LAB — TROPONIN I (HIGH SENSITIVITY)
Troponin I (High Sensitivity): 3 ng/L (ref <18)
Troponin I (High Sensitivity): 3 ng/L (ref <18)

## 2023-11-28 LAB — HIV ANTIBODY (ROUTINE TESTING W REFLEX): HIV Screen 4th Generation wRfx: NONREACTIVE

## 2023-11-28 LAB — CBC WITH DIFFERENTIAL/PLATELET
Abs Immature Granulocytes: 0.01 10*3/uL (ref 0.00–0.07)
Basophils Absolute: 0 10*3/uL (ref 0.0–0.1)
Basophils Relative: 0 %
Eosinophils Absolute: 0 10*3/uL (ref 0.0–0.5)
Eosinophils Relative: 0 %
HCT: 39.3 % (ref 39.0–52.0)
Hemoglobin: 13.7 g/dL (ref 13.0–17.0)
Immature Granulocytes: 0 %
Lymphocytes Relative: 15 %
Lymphs Abs: 0.8 10*3/uL (ref 0.7–4.0)
MCH: 29.6 pg (ref 26.0–34.0)
MCHC: 34.9 g/dL (ref 30.0–36.0)
MCV: 84.9 fL (ref 80.0–100.0)
Monocytes Absolute: 0.6 10*3/uL (ref 0.1–1.0)
Monocytes Relative: 10 %
Neutro Abs: 4 10*3/uL (ref 1.7–7.7)
Neutrophils Relative %: 75 %
Platelets: 186 10*3/uL (ref 150–400)
RBC: 4.63 MIL/uL (ref 4.22–5.81)
RDW: 12 % (ref 11.5–15.5)
WBC: 5.4 10*3/uL (ref 4.0–10.5)
nRBC: 0 % (ref 0.0–0.2)

## 2023-11-28 LAB — ECHOCARDIOGRAM COMPLETE
AR max vel: 2.95 cm2
AV Area VTI: 2.8 cm2
AV Area mean vel: 2.84 cm2
AV Mean grad: 4 mmHg
AV Peak grad: 8.6 mmHg
Ao pk vel: 1.47 m/s
Area-P 1/2: 3.89 cm2
Height: 72 in
S' Lateral: 3.2 cm
Weight: 3216 [oz_av]

## 2023-11-28 LAB — CBC
HCT: 39.9 % (ref 39.0–52.0)
Hemoglobin: 13.6 g/dL (ref 13.0–17.0)
MCH: 29.3 pg (ref 26.0–34.0)
MCHC: 34.1 g/dL (ref 30.0–36.0)
MCV: 86 fL (ref 80.0–100.0)
Platelets: 171 10*3/uL (ref 150–400)
RBC: 4.64 MIL/uL (ref 4.22–5.81)
RDW: 12.3 % (ref 11.5–15.5)
WBC: 4.4 10*3/uL (ref 4.0–10.5)
nRBC: 0 % (ref 0.0–0.2)

## 2023-11-28 LAB — RESP PANEL BY RT-PCR (RSV, FLU A&B, COVID)  RVPGX2
Influenza A by PCR: NEGATIVE
Influenza B by PCR: NEGATIVE
Resp Syncytial Virus by PCR: NEGATIVE
SARS Coronavirus 2 by RT PCR: NEGATIVE

## 2023-11-28 MED ORDER — VANCOMYCIN HCL IN DEXTROSE 1-5 GM/200ML-% IV SOLN
1000.0000 mg | Freq: Three times a day (TID) | INTRAVENOUS | Status: DC
  Administered 2023-11-28 – 2023-11-29 (×3): 1000 mg via INTRAVENOUS
  Filled 2023-11-28 (×4): qty 200

## 2023-11-28 MED ORDER — SODIUM CHLORIDE 0.9 % IV SOLN: 2.0000 g | Freq: Three times a day (TID) | INTRAVENOUS | Status: DC

## 2023-11-28 MED ORDER — DOCUSATE SODIUM 100 MG PO CAPS
100.0000 mg | ORAL_CAPSULE | Freq: Two times a day (BID) | ORAL | Status: DC | PRN
Start: 2023-11-28 — End: 2023-11-29
  Administered 2023-11-28: 100 mg via ORAL
  Filled 2023-11-28: qty 1

## 2023-11-28 MED ORDER — FAMOTIDINE 20 MG PO TABS
20.0000 mg | ORAL_TABLET | Freq: Every day | ORAL | Status: DC
  Administered 2023-11-28: 20 mg via ORAL
  Filled 2023-11-28: qty 1

## 2023-11-28 MED ORDER — HYDROCODONE-ACETAMINOPHEN 5-325 MG PO TABS: 1.0000 | ORAL_TABLET | Freq: Four times a day (QID) | ORAL | Status: DC | PRN

## 2023-11-28 MED ORDER — OXYCODONE HCL 5 MG PO TABS
5.0000 mg | ORAL_TABLET | ORAL | Status: DC | PRN
  Administered 2023-11-28 – 2023-11-29 (×4): 5 mg via ORAL
  Filled 2023-11-28 (×4): qty 1

## 2023-11-28 MED ORDER — VANCOMYCIN HCL 2000 MG/400ML IV SOLN
2000.0000 mg | Freq: Once | INTRAVENOUS | Status: DC
  Filled 2023-11-28: qty 400

## 2023-11-28 MED ORDER — GADOBUTROL 1 MMOL/ML IV SOLN
9.0000 mL | Freq: Once | INTRAVENOUS | Status: AC | PRN
Start: 2023-11-28 — End: 2023-11-28
  Administered 2023-11-28: 9 mL via INTRAVENOUS

## 2023-11-28 MED ORDER — HYDROCODONE-ACETAMINOPHEN 5-325 MG PO TABS
1.0000 | ORAL_TABLET | ORAL | Status: DC | PRN
  Administered 2023-11-28: 1 via ORAL
  Filled 2023-11-28: qty 1

## 2023-11-28 MED ORDER — IOHEXOL 350 MG/ML SOLN
75.0000 mL | Freq: Once | INTRAVENOUS | Status: AC | PRN
  Administered 2023-11-28: 75 mL via INTRAVENOUS

## 2023-11-28 MED ORDER — ONDANSETRON HCL 4 MG/2ML IJ SOLN
4.0000 mg | Freq: Once | INTRAMUSCULAR | Status: AC
Start: 2023-11-28 — End: 2023-11-28
  Administered 2023-11-28: 4 mg via INTRAVENOUS
  Filled 2023-11-28: qty 2

## 2023-11-28 MED ORDER — TIZANIDINE HCL 4 MG PO TABS
4.0000 mg | ORAL_TABLET | Freq: Four times a day (QID) | ORAL | Status: DC | PRN
  Administered 2023-11-29: 4 mg via ORAL
  Filled 2023-11-28: qty 1

## 2023-11-28 MED ORDER — SODIUM CHLORIDE 0.9 % IV SOLN: INTRAVENOUS | Status: AC

## 2023-11-28 MED ORDER — ONDANSETRON HCL 4 MG/2ML IJ SOLN
4.0000 mg | Freq: Once | INTRAMUSCULAR | Status: AC
  Administered 2023-11-28: 4 mg via INTRAVENOUS
  Filled 2023-11-28: qty 2

## 2023-11-28 MED ORDER — NALOXONE HCL 0.4 MG/ML IJ SOLN: 0.4000 mg | INTRAMUSCULAR | Status: DC | PRN

## 2023-11-28 MED ORDER — OXYCODONE HCL 5 MG PO TABS
5.0000 mg | ORAL_TABLET | Freq: Once | ORAL | Status: AC
  Administered 2023-11-28: 5 mg via ORAL
  Filled 2023-11-28: qty 1

## 2023-11-28 MED ORDER — GABAPENTIN 300 MG PO CAPS
300.0000 mg | ORAL_CAPSULE | Freq: Three times a day (TID) | ORAL | Status: DC
Start: 2023-11-28 — End: 2023-11-29
  Administered 2023-11-28 – 2023-11-29 (×4): 300 mg via ORAL
  Filled 2023-11-28 (×4): qty 1

## 2023-11-28 MED ORDER — VANCOMYCIN HCL IN DEXTROSE 1-5 GM/200ML-% IV SOLN: 1000.0000 mg | Freq: Three times a day (TID) | INTRAVENOUS | Status: DC

## 2023-11-28 MED ORDER — VANCOMYCIN HCL 2000 MG/400ML IV SOLN
2000.0000 mg | Freq: Once | INTRAVENOUS | Status: AC
  Administered 2023-11-28: 2000 mg via INTRAVENOUS
  Filled 2023-11-28: qty 400

## 2023-11-28 MED ORDER — SODIUM CHLORIDE 0.9 % IV SOLN
2.0000 g | Freq: Three times a day (TID) | INTRAVENOUS | Status: DC
  Administered 2023-11-28 – 2023-11-29 (×4): 2 g via INTRAVENOUS
  Filled 2023-11-28 (×4): qty 12.5

## 2023-11-28 MED ORDER — OXYCODONE HCL 5 MG PO TABS
5.0000 mg | ORAL_TABLET | Freq: Four times a day (QID) | ORAL | Status: AC | PRN
  Administered 2023-11-28: 5 mg via ORAL
  Filled 2023-11-28: qty 1

## 2023-11-28 MED ORDER — PANTOPRAZOLE SODIUM 40 MG PO TBEC
40.0000 mg | DELAYED_RELEASE_TABLET | Freq: Every day | ORAL | Status: DC
  Administered 2023-11-28 – 2023-11-29 (×2): 40 mg via ORAL
  Filled 2023-11-28 (×2): qty 1

## 2023-11-28 MED FILL — Thrombin For Soln 5000 Unit: CUTANEOUS | Qty: 2 | Status: AC

## 2023-11-28 NOTE — Progress Notes (Signed)
 Pharmacy Antibiotic Note  Orpheus Hayhurst is a 43 y.o. male admitted on 11/27/2023 with concern for sepsis after recent lumbar surgery.  Pharmacy has been consulted for vancomycin and cefepime dosing.  Plan: Vancomycin 2000mg  x1 then 1000mg  IV Q8H.. Goal AUC 400-550.  Expected AUC 460. Cefepime 2g IV Q8H.  Height: 6' (182.9 cm) Weight: 91.2 kg (201 lb) IBW/kg (Calculated) : 77.6  Temp (24hrs), Avg:98.2 F (36.8 C), Min:98.2 F (36.8 C), Max:98.2 F (36.8 C)  Recent Labs  Lab 11/27/23 2351  WBC 5.4  CREATININE 0.91    Estimated Creatinine Clearance: 114.9 mL/min (by C-G formula based on SCr of 0.91 mg/dL).    Allergies  Allergen Reactions   Amlodipine Swelling    Gums   Lisinopril Swelling    Hand and feet    Thank you for allowing pharmacy to be a part of this patient's care.  Lonnie Roberts, PharmD, BCPS  11/28/2023 5:30 AM

## 2023-11-28 NOTE — Progress Notes (Signed)
   Patient seen and examined at bedside, patient admitted after midnight, please see earlier detailed admission note by Juliette Oh, MD. Briefly, patient presented secondary to fever, chills, syncopal episode.  Initial concern for possible infectious etiology.  Blood cultures obtained and empiric antibiotics started.  Imaging of lumbar spine without evidence of infection  Subjective: No issues this morning. Feels well. No fevers.  BP 125/83 (BP Location: Right Arm)   Pulse 70   Temp 98.2 F (36.8 C) (Oral)   Resp 15   Ht 6' (1.829 m)   Wt 91.2 kg   SpO2 99%   BMI 27.26 kg/m   General exam: Appears calm and comfortable Respiratory system: Clear to auscultation. Respiratory effort normal. Cardiovascular system: S1 & S2 heard, RRR. No murmurs, rubs, gallops or clicks. Gastrointestinal system: Abdomen is nondistended, soft and nontender. No organomegaly or masses felt. Normal bowel sounds heard. Central nervous system: Alert and oriented. No focal neurological deficits. Musculoskeletal: No edema. No calf tenderness Skin: No cyanosis. No rashes Psychiatry: Judgement and insight appear normal. Mood & affect appropriate.   Brief assessment/Plan:  Syncope Unclear etiology.  Possibly related to orthostatic hypotension.  Patient also with associated fever and chills which could not suggest possible infectious etiology. - Follow-up echocardiogram - Check orthostatic vital signs  Fever Chills Complicated by recent spine surgery.  No suggestion of infection on MRI.  No leukocytosis.  No symptoms except for syncopal episode.  Blood cultures obtained on admission in addition to initiation of empiric antibiotics. -Continue vancomycin and cefepime - Follow-up culture data  Elevated AST/ALT Unclear etiology with numbers trending down.  No associate abdominal pain.  If patient had some hypotension, this could be related. - CMP in a.m.  History of displacement of lumbosacral  intervertebral disc left L5/S1 status post recent lumbar L5/S1 laminectomy/decompression microdiscectomy 1 level on 11/24/2023  Abnormal CT findings Per H&P   Family communication: Wife at bedside DVT prophylaxis: SCDs Disposition: Discharge home likely in 24 hours pending negative blood cultures and completion of syncope workup  Aneita Keens, MD [facility] 11/28/2023, 12:26 PM

## 2023-11-28 NOTE — H&P (Signed)
 History and Physical    Dennis Blanchard NWG:[id number] DOB: 1980-10-13 DOA: 11/27/2023  PCP: Charlane Ferretti, DO  Patient coming from: Home  Chief Complaint: Syncope  HPI: Dennis Blanchard is a 43 y.o. 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 Franky Macho presents to the ED via EMS for evaluation of syncope and fever.  Blood pressure 110/70, heart rate 81, SpO2 98% on room air, and CBG 170 with EMS.  Patient was given 500 mL normal saline by EMS.  Vital signs on arrival to the ED: Temperature 98.2 F, pulse 88, respiratory rate 17, blood pressure 122/77, SpO2 99% on room air.  EKG showing sinus rhythm and incomplete RBBB.  CBC unremarkable.  CMP notable for glucose 144, creatinine 0.9, AST 44, ALT 77, alk phos and T. bili normal, troponin negative x 2, COVID/influenza/RSV PCR negative, UA not suggestive of infection, blood cultures ordered.  CT angiogram chest negative for PE or acute cardiopulmonary disease.  Showing dependent groundglass opacities likely reflecting dependent atelectasis.  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  CT lumbar spine showing nonspecific edema and a few locules of gas at the site of the left laminectomy at L5 and per radiologist superimposed infectious process not excluded.  No evidence of acute fracture or traumatic malalignment.  ED PA had discussed CT findings with Dr. Franky Macho who felt that infectious process at surgical site is less likely. Patient was given Zofran, oxycodone, and 1 L normal saline in the ED.  [facility] called to admit.  History provided by the patient and his wife at bedside.  He had recent back surgery done on 4/11.  He was  not feeling well all day yesterday and did not eat or drink much.  He had fever with temperature >101 F and was having chills.  He had an episode of near syncope earlier in the day and then later in the evening patient was walking when he felt worse and felt like he was going to pass out.  Wife was there with him and as he tried to sit on the rollator walker seat, he lost consciousness.  Wife lowered him to the ground and he did not strike his head or sustain any injuries.  Wife states patient was unconscious for less than a minute.  He appeared very pale at that time and was drenched in sweat.  He did not have any obvious seizure-like activity at that time and no previous history of seizures.  He used to be on a diuretic last year but currently no longer on any antihypertensives.  Patient denies any chest pain or shortness of breath.  Wife has been checking his surgical incision site and it does not look infected.  He is taking oxycodone 5 mg every 6 hours to help with pain since after his back surgery.  No abdominal pain, vomiting, or diarrhea reported.  Review of Systems:  Review of Systems  All other systems reviewed and are negative.   Past Medical History:  Diagnosis Date   Arthritis    Carpal tunnel syndrome of right wrist    Family history of Lynch syndrome    GERD (gastroesophageal reflux disease)    Hyperlipidemia    Hypertension    OA (osteoarthritis)    PONV (postoperative nausea and vomiting)  Past Surgical History:  Procedure Laterality Date   GUM SURGERY     LAMINECTOMY  2017   LUMBAR LAMINECTOMY/DECOMPRESSION MICRODISCECTOMY Left 11/24/2023   Procedure: LUMBAR LAMINECTOMY/DECOMPRESSION MICRODISCECTOMY 1 LEVEL;  Surgeon: Coletta Memos, MD;  Location: [facility] OR;  Service: Neurosurgery;  Laterality: Left;  Microdiscectomy - left - Lumbar five-Sacral one   SKIN CANCER EXCISION  2019   VARICOCELECTOMY  2018     reports that he has never smoked. He has never used smokeless  tobacco. He reports current alcohol use. He reports that he does not use drugs.  Allergies  Allergen Reactions   Amlodipine Swelling    Gums   Lisinopril Swelling    Hand and feet    Family History  Problem Relation Age of Onset   Hypertension Mother    Depression Mother    Hyperlipidemia Father    Other Father        Multi system atrophy   Cataracts Maternal Grandmother    Colon cancer Maternal Grandmother    CVA Maternal Grandfather    Uterine cancer Paternal Grandmother        MSH6 mutation   Rheum arthritis Paternal Grandmother    Brain cancer Paternal Grandfather    Aneurysm Maternal Uncle        brain   Esophageal cancer Maternal Uncle    Lung cancer Maternal Uncle    Breast cancer Paternal Aunt     Prior to Admission medications   Medication Sig Start Date End Date Taking? Authorizing Provider  cetirizine (ZYRTEC) 10 MG tablet Take 10 mg by mouth daily.   Yes [provider]  docusate sodium (COLACE) 100 MG capsule Take 100 mg by mouth 2 (two) times daily as needed for mild constipation.   Yes [provider]  famotidine (PEPCID) 20 MG tablet Take 1 tablet (20 mg total) by mouth at bedtime. 09/25/23  Yes May, Deanna J, NP  gabapentin (NEURONTIN) 300 MG capsule Take 300 mg by mouth 3 (three) times daily. 08/30/19  Yes [provider]  HYDROcodone-acetaminophen (NORCO/VICODIN) 5-325 MG tablet Take 1 tablet by mouth every 6 (six) hours as needed for up to 7 days for moderate pain (pain score 4-6). 11/24/23 12/01/23 Yes Coletta Memos, MD  Multiple Vitamin (MULTIVITAMIN WITH MINERALS) TABS tablet Take 1 tablet by mouth daily.   Yes [provider]  omeprazole (PRILOSEC) 40 MG capsule Take 1 capsule (40 mg total) by mouth daily. Patient taking differently: Take 40 mg by mouth in the morning and at bedtime. 10/27/23  Yes Imogene Burn, MD  rosuvastatin (CRESTOR) 10 MG tablet Take 10 mg by mouth at bedtime. 09/12/19  Yes [provider]   tiZANidine (ZANAFLEX) 4 MG tablet Take 1 tablet (4 mg total) by mouth every 6 (six) hours as needed for muscle spasms. 11/24/23  Yes Coletta Memos, MD    Physical Exam: Vitals:   11/28/23 0230 11/28/23 0245 11/28/23 0300 11/28/23 0330  BP: 114/70 107/72 111/76 112/76  Pulse: 65 66 74 72  Resp: 16 18 18 15   Temp:      TempSrc:      SpO2: 97% 97% 98% 98%  Weight:      Height:        Physical Exam Vitals reviewed.  Constitutional:      General: He is not in acute distress. HENT:     Head: Normocephalic and atraumatic.  Eyes:     Extraocular Movements: Extraocular movements intact.  Cardiovascular:  Rate and Rhythm: Normal rate and regular rhythm.     Pulses: Normal pulses.  Pulmonary:     Effort: Pulmonary effort is normal. No respiratory distress.     Breath sounds: Normal breath sounds. No wheezing or rales.  Abdominal:     General: Bowel sounds are normal. There is no distension.     Palpations: Abdomen is soft.     Tenderness: There is no abdominal tenderness. There is no guarding.  Musculoskeletal:     Cervical back: Normal range of motion.     Right lower leg: No edema.     Left lower leg: No edema.     Comments: Lumbar surgical incision site without obvious signs of infection.  No erythema, warmth, or drainage.  Skin:    General: Skin is warm and dry.  Neurological:     General: No focal deficit present.     Mental Status: He is alert and oriented to person, place, and time.     Cranial Nerves: No cranial nerve deficit.     Sensory: No sensory deficit.     Motor: No weakness.     Labs on Admission: I have personally reviewed following labs and imaging studies  CBC: Recent Labs  Lab 11/27/23 2351  WBC 5.4  NEUTROABS 4.0  HGB 13.7  HCT 39.3  MCV 84.9  PLT 186   Basic Metabolic Panel: Recent Labs  Lab 11/27/23 2351  NA 135  K 3.8  CL 103  CO2 24  GLUCOSE 144*  BUN 9  CREATININE 0.91  CALCIUM 8.5*   GFR: Estimated Creatinine Clearance:  114.9 mL/min (by C-G formula based on SCr of 0.91 mg/dL). Liver Function Tests: Recent Labs  Lab 11/27/23 2351  AST 44*  ALT 77*  ALKPHOS 90  BILITOT 0.8  PROT 6.1*  ALBUMIN 3.4*   No results for input(s): "LIPASE", "AMYLASE" in the last 168 hours. No results for input(s): "AMMONIA" in the last 168 hours. Coagulation Profile: No results for input(s): "INR", "PROTIME" in the last 168 hours. Cardiac Enzymes: No results for input(s): "CKTOTAL", "CKMB", "CKMBINDEX", "TROPONINI" in the last 168 hours. BNP (last 3 results) No results for input(s): "PROBNP" in the last 8760 hours. HbA1C: No results for input(s): "HGBA1C" in the last 72 hours. CBG: No results for input(s): "GLUCAP" in the last 168 hours. Lipid Profile: No results for input(s): "CHOL", "HDL", "LDLCALC", "TRIG", "CHOLHDL", "LDLDIRECT" in the last 72 hours. Thyroid Function Tests: No results for input(s): "TSH", "T4TOTAL", "FREET4", "T3FREE", "THYROIDAB" in the last 72 hours. Anemia Panel: No results for input(s): "VITAMINB12", "FOLATE", "FERRITIN", "TIBC", "IRON", "RETICCTPCT" in the last 72 hours. Urine analysis:    Component Value Date/Time   COLORURINE YELLOW 11/28/2023 0227   APPEARANCEUR CLEAR 11/28/2023 0227   LABSPEC 1.023 11/28/2023 0227   PHURINE 7.0 11/28/2023 0227   GLUCOSEU NEGATIVE 11/28/2023 0227   HGBUR NEGATIVE 11/28/2023 0227   BILIRUBINUR NEGATIVE 11/28/2023 0227   KETONESUR NEGATIVE 11/28/2023 0227   PROTEINUR NEGATIVE 11/28/2023 0227   NITRITE NEGATIVE 11/28/2023 0227   LEUKOCYTESUR NEGATIVE 11/28/2023 0227    Radiological Exams on Admission: CT LUMBAR SPINE W CONTRAST Result Date: 11/28/2023 CLINICAL DATA:  fever, syncope EXAM: CT LUMBAR SPINE WITH CONTRAST TECHNIQUE: Multidetector CT imaging of the lumbar spine was performed with intravenous contrast administration. RADIATION DOSE REDUCTION: This exam was performed according to the departmental dose-optimization program which includes  automated exposure control, adjustment of the mA and/or kV according to patient size and/or use of iterative reconstruction  technique. CONTRAST:  75mL OMNIPAQUE IOHEXOL 350 MG/ML SOLN COMPARISON:  MRI lumbar spine October 31, 2023. FINDINGS: Alignment: Normal. Vertebrae: No acute fracture or focal pathologic process. Paraspinal and other soft tissues: Left paraspinal edema at L5 Disc levels: Nonspecific edema and a few locules of gas at site of left laminectomy at L5, compatible with reported history of recent back surgery. IMPRESSION: 1. Nonspecific edema and a few locules of gas at site of left laminectomy at L5, compatible with reported history of recent back surgery. Superimposed infectious process is not excluded. MRI with contrast could further evaluate if clinically warranted. 2. No evidence of acute fracture or traumatic malalignment. Electronically Signed   By: Feliberto Harts M.D.   On: 11/28/2023 01:41   CT Angio Chest PE W and/or Wo Contrast Result Date: 11/28/2023 CLINICAL DATA:  Pulmonary embolism (PE) suspected, high prob. Syncope. Fever. EXAM: CT ANGIOGRAPHY CHEST WITH CONTRAST TECHNIQUE: Multidetector CT imaging of the chest was performed using the standard protocol during bolus administration of intravenous contrast. Multiplanar CT image reconstructions and MIPs were obtained to evaluate the vascular anatomy. RADIATION DOSE REDUCTION: This exam was performed according to the departmental dose-optimization program which includes automated exposure control, adjustment of the mA and/or kV according to patient size and/or use of iterative reconstruction technique. CONTRAST:  75mL OMNIPAQUE IOHEXOL 350 MG/ML SOLN COMPARISON:  None Available. FINDINGS: Cardiovascular: No filling defects in the pulmonary arteries to suggest pulmonary emboli. Heart is normal size. Aorta is normal caliber. Mediastinum/Nodes: No mediastinal, hilar, or axillary adenopathy. Trachea and esophagus are unremarkable. Thyroid  unremarkable. Lungs/Pleura: No confluent airspace opacities or effusions. Dependent ground-glass opacities likely reflect atelectasis. Bilateral extrapleural nodules noted measuring 1.6 cm on the left and 9 mm on the right seen on image 116. Another nodule is noted in a similar location more superiorly on image 87 measuring 2 cm on the left and on the right on image 100 measuring 1.5 cm and image 76. Also on the right on image 40. 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 Upper Abdomen: No acute findings Musculoskeletal: Chest wall soft tissues are unremarkable. No acute bony abnormality. Review of the MIP images confirms the above findings. IMPRESSION: No acute cardiopulmonary disease. Dependent ground-glass opacities likely reflect dependent atelectasis. No evidence of pulmonary embolus. 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 Electronically Signed   By: Charlett Nose M.D.   On: 11/28/2023 01:28   DG Chest 2 View Result Date: 11/28/2023 CLINICAL DATA:  Postop fever, syncope EXAM: CHEST - 2 VIEW COMPARISON:  None Available. FINDINGS: The heart size and mediastinal contours are within normal limits. Both lungs are clear. The visualized skeletal structures are unremarkable. IMPRESSION: Normal study. Electronically Signed   By: Charlett Nose M.D.   On: 11/28/2023 00:10    Assessment and Plan  Syncope EKG showing sinus rhythm and incomplete RBBB.  Troponin negative x 2.  CTA chest negative for PE or acute cardiopulmonary disease.  No obvious seizure-like activity reported. ?Orthostatic vs vasovagal event. 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  Patient is afebrile in the ED and not tachycardic.  No leukocytosis on  labs. ED PA had discussed CT findings with Dr. Michale Age who felt that infectious process at surgical site is less likely.  However, patient is reporting chills and fever with temperature > 101 F at home.  No other obvious infectious source on identified workup. -Will cover with antibiotics for now including vancomycin and cefepime -Follow-up blood cultures -MRI of lumbar spine -Continue pain medications: Gabapentin, oxycodone PRN, and Zanaflex PRN  Mild transaminitis No abdominal pain or tenderness.  Hold statin at this time and repeat LFTs.  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   I have discussed this with the patient and encouraged close outpatient follow-up due to potential risk of malignant transformation.  GERD Continue PPI and H2 blocker.  DVT prophylaxis: SCDs Code Status: Full Code (discussed with the patient) Family Communication: Wife at bedside. Level of care: Telemetry bed Admission status: It is my clinical opinion that referral for OBSERVATION is reasonable and necessary in this patient based on the above information provided. The aforementioned taken together are felt to place the patient at high risk for further clinical deterioration. However, it is anticipated that the patient may be medically stable for discharge from the hospital within 24 to 48 hours.  Juliette Oh MD [facility]  If 7PM-7AM, please contact night-coverage [email or web address]  11/28/2023, 4:11 AM

## 2023-11-28 NOTE — Progress Notes (Signed)
  Echocardiogram 2D Echocardiogram has been performed.  Dennis Blanchard 11/28/2023, 3:09 PM

## 2023-11-28 NOTE — Plan of Care (Signed)

## 2023-11-29 DIAGNOSIS — R55 Syncope and collapse: Secondary | ICD-10-CM | POA: Diagnosis not present

## 2023-11-29 DIAGNOSIS — R509 Fever, unspecified: Secondary | ICD-10-CM | POA: Insufficient documentation

## 2023-11-29 DIAGNOSIS — Z9889 Other specified postprocedural states: Secondary | ICD-10-CM

## 2023-11-29 DIAGNOSIS — K219 Gastro-esophageal reflux disease without esophagitis: Secondary | ICD-10-CM | POA: Insufficient documentation

## 2023-11-29 LAB — COMPREHENSIVE METABOLIC PANEL WITH GFR
ALT: 55 U/L — ABNORMAL HIGH (ref 0–44)
AST: 22 U/L (ref 15–41)
Albumin: 3.2 g/dL — ABNORMAL LOW (ref 3.5–5.0)
Alkaline Phosphatase: 77 U/L (ref 38–126)
Anion gap: 8 (ref 5–15)
BUN: 11 mg/dL (ref 6–20)
CO2: 26 mmol/L (ref 22–32)
Calcium: 8.9 mg/dL (ref 8.9–10.3)
Chloride: 104 mmol/L (ref 98–111)
Creatinine, Ser: 0.99 mg/dL (ref 0.61–1.24)
GFR, Estimated: >60 mL/min (ref >60)
Glucose, Bld: 113 mg/dL — ABNORMAL HIGH (ref 70–99)
Potassium: 4 mmol/L (ref 3.5–5.1)
Sodium: 138 mmol/L (ref 135–145)
Total Bilirubin: 0.9 mg/dL (ref 0.0–1.2)
Total Protein: 5.8 g/dL — ABNORMAL LOW (ref 6.5–8.1)

## 2023-11-29 LAB — PROCALCITONIN: Procalcitonin: <0.1 ng/mL

## 2023-11-29 MED ORDER — OXYCODONE HCL 5 MG PO TABS: 5.0000 mg | ORAL_TABLET | Freq: Four times a day (QID) | ORAL | 0 refills | Status: DC | PRN

## 2023-11-29 MED ORDER — ONDANSETRON 4 MG PO TBDP: 4.0000 mg | ORAL_TABLET | Freq: Three times a day (TID) | ORAL | 0 refills | Status: AC | PRN

## 2023-11-29 MED ORDER — ONDANSETRON HCL 4 MG/2ML IJ SOLN
4.0000 mg | Freq: Four times a day (QID) | INTRAMUSCULAR | Status: DC | PRN
  Administered 2023-11-29: 4 mg via INTRAVENOUS
  Filled 2023-11-29: qty 2

## 2023-11-29 NOTE — Progress Notes (Signed)
 Pt has a visit with Dr. Audie Bleacher on 12/14/23 @0930 

## 2023-11-29 NOTE — Plan of Care (Signed)
  Problem: Education: Goal: Knowledge of General Education information will improve Description: Including pain rating scale, medication(s)/side effects and non-pharmacologic comfort measures Outcome: Progressing   Problem: Health Behavior/Discharge Planning: Goal: Ability to manage health-related needs will improve Outcome: Progressing   Problem: Clinical Measurements: Goal: Will remain free from infection Outcome: Progressing   Problem: Activity: Goal: Risk for activity intolerance will decrease Outcome: Progressing   Problem: Nutrition: Goal: Adequate nutrition will be maintained Outcome: Progressing   Problem: Elimination: Goal: Will not experience complications related to bowel motility Outcome: Progressing Goal: Will not experience complications related to urinary retention Outcome: Progressing   Problem: Pain Managment: Goal: General experience of comfort will improve and/or be controlled Outcome: Not Progressing

## 2023-11-29 NOTE — Discharge Summary (Signed)
 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  Outpatient follow-up  Discharge Condition: Stable, improved CODE STATUS: Full code Diet recommendation: Regular diet  Brief/Interim Summary: From H&P by Dr. Loney Loh: "Dennis Blanchard is a 43 y.o. 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 Franky Macho presents to the ED via EMS for evaluation of syncope and fever.  Blood pressure 110/70, heart rate 81, SpO2 98% on room air, and CBG 170 with EMS.  Patient was given 500 mL normal saline by EMS.  Vital signs on arrival to the ED: Temperature 98.2 F, pulse 88, respiratory rate 17, blood pressure 122/77, SpO2 99% on room air.  EKG showing sinus rhythm and incomplete RBBB.  CBC unremarkable.  CMP notable for glucose 144, creatinine 0.9, AST 44, ALT 77, alk phos and T. bili normal, troponin negative x 2, COVID/influenza/RSV PCR negative, UA not suggestive of infection, blood cultures ordered.  CT angiogram chest negative for PE or acute cardiopulmonary disease.  Showing dependent groundglass opacities likely reflecting dependent atelectasis.  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  CT lumbar spine showing nonspecific edema and a few locules of gas at the site of the left laminectomy at L5 and per radiologist superimposed infectious process not excluded.  No  evidence of acute fracture or traumatic malalignment.  ED PA had discussed CT findings with Dr. Franky Macho who felt that infectious process at surgical site is less likely. Patient was given Zofran, oxycodone, and 1 L normal saline in the ED.  [facility] called to admit.   History provided by the patient and his wife at bedside.  He had recent back surgery done on 4/11.  He was not feeling well all day yesterday and did not eat or drink much.  He had fever with temperature >101 F and was having chills.  He had an episode of near syncope earlier in the day and then later in the evening patient was walking when he felt worse and felt like he was going to pass out.  Wife was there with him and as he tried to sit on the rollator walker seat, he lost consciousness.  Wife lowered him to the ground and he did not strike his head or sustain any injuries.  Wife states patient was unconscious for less than a minute.  He appeared very pale at that time and was drenched in sweat.  He did not have any obvious seizure-like activity at that time and no previous history of seizures.  He used to be on a diuretic last year but currently no longer on any antihypertensives.  Patient denies any chest pain or shortness of breath.  Wife has been checking his surgical incision site and it does not look infected.  He is taking oxycodone 5 mg every 6 hours to help with pain since after his back surgery.  No abdominal pain, vomiting, or diarrhea reported."  Patient was treated with empiric IV antibiotics.  Blood cultures were negative to date.  MRI lumbar spine without clear source for any infection.  His orthostatic vital sign was negative.  Echocardiogram was unremarkable, specifically negative for aortic stenosis.  Syncope was thought to be vasovagal event.  Patient remained afebrile in the hospital without any source of infection found.  He was discharged home in stable condition.  Discharge Diagnoses:   Principal Problem:    Syncope Active Problems:   S/P laminectomy   GERD (gastroesophageal reflux disease)   Fever    Discharge Instructions  Discharge Instructions     Call MD for:  difficulty breathing, headache or visual disturbances   Complete by: As directed    Call MD for:  extreme fatigue   Complete by: As directed    Call MD for:  persistant dizziness or light-headedness   Complete by: As directed    Call MD for:  persistant nausea and vomiting   Complete by: As directed    Call MD for:  redness, tenderness, or signs of infection (pain, swelling, redness, odor or green/yellow discharge around incision site)   Complete by: As directed    Call MD for:  severe uncontrolled pain   Complete by: As directed    Call MD for:  temperature >100.4   Complete by: As directed    Discharge instructions   Complete by: As directed    You were cared for by a hospitalist during your hospital stay. If you have any questions about your discharge medications or the care you received while you were in the hospital after you are discharged, you can call the unit and ask to speak with the hospitalist on call if the hospitalist that took care of you is not available. Once you are discharged, your primary care physician will handle any further medical issues. Please note that NO REFILLS for any discharge medications will be authorized once you are discharged, as it is imperative that you return to your primary care physician (or establish a relationship with a primary care physician if you do not have one) for your aftercare needs so that they can reassess your need for medications and monitor your lab values.   Increase activity slowly   Complete by: As directed       Allergies as of 11/29/2023       Reactions   Amlodipine Swelling   Gums   Lisinopril Swelling   Hand and feet        Medication List     TAKE these medications    cetirizine 10 MG tablet Commonly known as: ZYRTEC Take 10 mg by mouth daily.    docusate sodium 100 MG capsule Commonly known as: COLACE Take 100 mg by mouth 2 (two) times daily as needed for mild constipation.   famotidine 20 MG tablet Commonly known as: PEPCID Take 1 tablet (20 mg total) by mouth at bedtime.   gabapentin 300 MG capsule Commonly known as: NEURONTIN Take 300 mg by mouth 3 (three) times daily.   HYDROcodone-acetaminophen 5-325 MG tablet Commonly known as: NORCO/VICODIN Take 1 tablet by mouth every 6 (six) hours as needed for up to 7 days for moderate pain (pain score 4-6).   multivitamin with minerals Tabs tablet Take 1 tablet by mouth daily.   omeprazole 40 MG capsule Commonly known as: PRILOSEC Take 1 capsule (40 mg total) by mouth daily. What changed: when to take this   ondansetron 4 MG disintegrating tablet Commonly known as: ZOFRAN-ODT Take 1 tablet (4 mg total) by mouth every 8 (eight) hours as needed for nausea or vomiting.   rosuvastatin  10 MG tablet Commonly known as: CRESTOR Take 10 mg by mouth at bedtime.   tiZANidine 4 MG tablet Commonly known as: ZANAFLEX Take 1 tablet (4 mg total) by mouth every 6 (six) hours as needed for muscle spasms.        Follow-up Information     Windell Hasty, DO Follow up.   Specialty: Internal Medicine Contact information: [address] [place] [address] [phone]         Audie Bleacher, MD Follow up.   Specialty: Neurosurgery Contact information: [address] [place] [address] [phone]                Allergies  Allergen Reactions   Amlodipine Swelling    Gums   Lisinopril Swelling    Hand and feet      Procedures/Studies: ECHOCARDIOGRAM COMPLETE Result Date: 11/28/2023    ECHOCARDIOGRAM REPORT   Patient Name:   ARLAN BIRKS Date of Exam: 11/28/2023 Medical Rec #:  [id number]    Height:       72.0 in Accession #:    [id number]   Weight:       201.0 lb Date of Birth:  1980/12/27    BSA:          2.135 m Patient Age:    43 years      BP:           118/75 mmHg Patient Gender: M            HR:           81 bpm. Exam Location:  Inpatient Procedure: 2D Echo, Cardiac Doppler and Color Doppler (Both Spectral and Color            Flow Doppler were utilized during procedure). Indications:    Syncope  History:        Patient has no prior history of Echocardiogram examinations.                 Risk Factors:Hypertension and Dyslipidemia.  Sonographer:    Reta Cassis Referring Phys: [id number] FIEPPIRJJ RATHORE  Sonographer Comments: Global longitudinal strain was attempted. IMPRESSIONS  1. Left ventricular ejection fraction, by estimation, is 60 to 65%. Left ventricular ejection fraction by 3D volume is 73 %. The left ventricle has normal function. The left ventricle has no regional wall motion abnormalities. Left ventricular diastolic  parameters were normal. The average left ventricular global longitudinal strain is -21.4 %. The global longitudinal strain is normal.  2. Right ventricular systolic function is normal. The right ventricular size is normal.  3. The mitral valve is normal in structure. No evidence of mitral valve regurgitation. No evidence of mitral stenosis.  4. The aortic valve is normal in structure. Aortic valve regurgitation is not visualized. No aortic stenosis is present.  5. The inferior vena cava is dilated in size with >50% respiratory variability, suggesting right atrial pressure of 8 mmHg. FINDINGS  Left Ventricle: Left ventricular ejection fraction, by estimation, is 60 to 65%. Left ventricular ejection fraction by 3D volume is 73 %. The left ventricle has normal function. The left ventricle has no regional wall motion abnormalities. The average left ventricular global longitudinal strain is -21.4 %. Strain was performed and the global longitudinal strain is normal. The left ventricular internal cavity size was normal in size. There is no left ventricular hypertrophy. Left ventricular diastolic parameters were normal. Right  Ventricle: The right ventricular size is normal. No increase in right ventricular wall  thickness. Right ventricular systolic function is normal. Left Atrium: Left atrial size was normal in size. Right Atrium: Right atrial size was normal in size. Pericardium: There is no evidence of pericardial effusion. Mitral Valve: The mitral valve is normal in structure. No evidence of mitral valve regurgitation. No evidence of mitral valve stenosis. Tricuspid Valve: The tricuspid valve is normal in structure. Tricuspid valve regurgitation is not demonstrated. No evidence of tricuspid stenosis. Aortic Valve: The aortic valve is normal in structure. Aortic valve regurgitation is not visualized. No aortic stenosis is present. Aortic valve mean gradient measures 4.0 mmHg. Aortic valve peak gradient measures 8.6 mmHg. Aortic valve area, by VTI measures 2.80 cm. Pulmonic Valve: The pulmonic valve was normal in structure. Pulmonic valve regurgitation is not visualized. No evidence of pulmonic stenosis. Aorta: The aortic root is normal in size and structure. Venous: The inferior vena cava is dilated in size with greater than 50% respiratory variability, suggesting right atrial pressure of 8 mmHg. IAS/Shunts: No atrial level shunt detected by color flow Doppler. Additional Comments: 3D was performed not requiring image post processing on an independent workstation and was normal.  LEFT VENTRICLE PLAX 2D LVIDd:         5.30 cm         Diastology LVIDs:         3.20 cm         LV e' medial:    12.30 cm/s LV PW:         0.80 cm         LV E/e' medial:  8.5 LV IVS:        0.80 cm         LV e' lateral:   15.10 cm/s LVOT diam:     2.10 cm         LV E/e' lateral: 6.9 LV SV:         74 LV SV Index:   35              2D Longitudinal LVOT Area:     3.46 cm        Strain                                2D Strain GLS   -21.4 %                                Avg:                                 3D Volume EF                                LV 3D  EF:    Left                                             ventricul                                             ar  ejection                                             fraction                                             by 3D                                             volume is                                             73 %.                                 3D Volume EF:                                3D EF:        73 %                                LV EDV:       155 ml                                LV ESV:       42 ml                                LV SV:        113 ml RIGHT VENTRICLE             IVC RV Basal diam:  3.60 cm     IVC diam: 2.10 cm RV S prime:     15.20 cm/s TAPSE (M-mode): 3.0 cm LEFT ATRIUM             Index        RIGHT ATRIUM           Index LA diam:        3.30 cm 1.55 cm/m   RA Area:     14.00 cm LA Vol (A2C):   53.1 ml 24.87 ml/m  RA Volume:   31.00 ml  14.52 ml/m LA Vol (A4C):   13.9 ml 6.51 ml/m LA Biplane Vol: 28.0 ml 13.11 ml/m  AORTIC VALVE AV Area (Vmax):    2.95 cm AV Area (Vmean):   2.84 cm AV Area (VTI):     2.80 cm AV Vmax:           147.00 cm/s AV Vmean:          95.100 cm/s AV VTI:            0.265 m AV Peak Grad:      8.6 mmHg AV  Mean Grad:      4.0 mmHg LVOT Vmax:         125.00 cm/s LVOT Vmean:        78.000 cm/s LVOT VTI:          0.214 m LVOT/AV VTI ratio: 0.81  AORTA Ao Root diam: 2.80 cm MITRAL VALVE MV Area (PHT): 3.89 cm     SHUNTS MV Decel Time: 195 msec     Systemic VTI:  0.21 m MV E velocity: 104.00 cm/s  Systemic Diam: 2.10 cm MV A velocity: 73.90 cm/s MV E/A ratio:  1.41 Dorothye Gathers MD Electronically signed by Dorothye Gathers MD Signature Date/Time: 11/28/2023/3:35:47 PM    Final    MR Lumbar Spine W Wo Contrast Result Date: 11/28/2023 CLINICAL DATA:  Low back pain, infection suspected. EXAM: MRI LUMBAR SPINE WITHOUT AND WITH CONTRAST TECHNIQUE: Multiplanar and multiecho pulse sequences of the lumbar spine were  obtained without and with intravenous contrast. CONTRAST:  9mL GADAVIST GADOBUTROL 1 MMOL/ML IV SOLN COMPARISON:  Lumbar CT from earlier the same day. Lumbar MRI 10/31/2023 FINDINGS: Segmentation:  5 lumbar type vertebrae Alignment:  Slight L5-S1 retrolisthesis. Vertebrae:  No fracture, evidence of discitis, or bone lesion. Conus medullaris and cauda equina: Conus extends to the L1 level. Conus and cauda equina appear normal. Paraspinal and other soft tissues: Expected postoperative scarring in the posterior soft tissues at L4, L5. cystic appearance symmetrically affects the extraforaminal L5 nerve roots, nonprogressive. Disc levels: L4-L5: Disc desiccation and narrowing with central protrusion. Negative facets. Patent canal and foramina, prior laminectomy. L5-S1:Disc narrowing and bulging with posterior annular fissure. Micro discectomy left paracentral with improved subarticular recess patency. Patent foramina with mild narrowing on the left due to bulging disc. IMPRESSION: Improved patency of the L5-S1 left subarticular recess after decompression. Stable postoperative L4-5 level without recurrent impingement. No new abnormality. Electronically Signed   By: Ronnette Coke M.D.   On: 11/28/2023 06:59   CT LUMBAR SPINE W CONTRAST Result Date: 11/28/2023 CLINICAL DATA:  fever, syncope EXAM: CT LUMBAR SPINE WITH CONTRAST TECHNIQUE: Multidetector CT imaging of the lumbar spine was performed with intravenous contrast administration. RADIATION DOSE REDUCTION: This exam was performed according to the departmental dose-optimization program which includes automated exposure control, adjustment of the mA and/or kV according to patient size and/or use of iterative reconstruction technique. CONTRAST:  75mL OMNIPAQUE IOHEXOL 350 MG/ML SOLN COMPARISON:  MRI lumbar spine October 31, 2023. FINDINGS: Alignment: Normal. Vertebrae: No acute fracture or focal pathologic process. Paraspinal and other soft tissues: Left paraspinal  edema at L5 Disc levels: Nonspecific edema and a few locules of gas at site of left laminectomy at L5, compatible with reported history of recent back surgery. IMPRESSION: 1. Nonspecific edema and a few locules of gas at site of left laminectomy at L5, compatible with reported history of recent back surgery. Superimposed infectious process is not excluded. MRI with contrast could further evaluate if clinically warranted. 2. No evidence of acute fracture or traumatic malalignment. Electronically Signed   By: Stevenson Elbe M.D.   On: 11/28/2023 01:41   CT Angio Chest PE W and/or Wo Contrast Result Date: 11/28/2023 CLINICAL DATA:  Pulmonary embolism (PE) suspected, high prob. Syncope. Fever. EXAM: CT ANGIOGRAPHY CHEST WITH CONTRAST TECHNIQUE: Multidetector CT imaging of the chest was performed using the standard protocol during bolus administration of intravenous contrast. Multiplanar CT image reconstructions and MIPs were obtained to evaluate the vascular anatomy. RADIATION DOSE REDUCTION: This exam was performed according to the  departmental dose-optimization program which includes automated exposure control, adjustment of the mA and/or kV according to patient size and/or use of iterative reconstruction technique. CONTRAST:  75mL OMNIPAQUE IOHEXOL 350 MG/ML SOLN COMPARISON:  None Available. FINDINGS: Cardiovascular: No filling defects in the pulmonary arteries to suggest pulmonary emboli. Heart is normal size. Aorta is normal caliber. Mediastinum/Nodes: No mediastinal, hilar, or axillary adenopathy. Trachea and esophagus are unremarkable. Thyroid unremarkable. Lungs/Pleura: No confluent airspace opacities or effusions. Dependent ground-glass opacities likely reflect atelectasis. Bilateral extrapleural nodules noted measuring 1.6 cm on the left and 9 mm on the right seen on image 116. Another nodule is noted in a similar location more superiorly on image 87 measuring 2 cm on the left and on the right on image  100 measuring 1.5 cm and image 76. Also on the right on image 40. 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 Upper Abdomen: No acute findings Musculoskeletal: Chest wall soft tissues are unremarkable. No acute bony abnormality. Review of the MIP images confirms the above findings. IMPRESSION: No acute cardiopulmonary disease. Dependent ground-glass opacities likely reflect dependent atelectasis. No evidence of pulmonary embolus. 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 Electronically Signed   By: Charlett Nose M.D.   On: 11/28/2023 01:28   DG Chest 2 View Result Date: 11/28/2023 CLINICAL DATA:  Postop fever, syncope EXAM: CHEST - 2 VIEW COMPARISON:  None Available. FINDINGS: The heart size and mediastinal contours are within normal limits. Both lungs are clear. The visualized skeletal structures are unremarkable. IMPRESSION: Normal study. Electronically Signed   By: Charlett Nose M.D.   On: 11/28/2023 00:10   DG Lumbar Spine 2-3 Views Result Date: 11/25/2023 CLINICAL DATA:  Lumbar surgery EXAM: LUMBAR SPINE - 2-3 VIEW COMPARISON:  Lumbar MRI FINDINGS: Two lateral intraoperative views lumbar spine. Initial view demonstrates sharp tip probe at L5-S1. Second image demonstrates a curved probe with the tip at the L5-S1 disc space. IMPRESSION: Two intraoperative views as above. Electronically Signed   By: Genevive Bi M.D.   On: 11/25/2023 16:29       Discharge Exam: Vitals:   11/29/23 0802 11/29/23 1216  BP: 120/75 115/83  Pulse: 77 82  Resp: 17 17  Temp: 97.6 F (36.4 C)   SpO2:  96%    General: Pt is alert, awake, not in acute distress Cardiovascular: RRR, S1/S2 +, no edema Respiratory: CTA bilaterally, no wheezing, no rhonchi, no respiratory distress, no conversational dyspnea  Abdominal: Soft, NT, ND, bowel sounds + Extremities: no edema, no  cyanosis Psych: Normal mood and affect, stable judgement and insight     The results of significant diagnostics from this hospitalization (including imaging, microbiology, ancillary and laboratory) are listed below for reference.     Microbiology: Recent Results (from the past 240 hours)  Resp panel by RT-PCR (RSV, Flu A&B, Covid) Anterior Nasal Swab     Status: None   Collection Time: 11/27/23 11:51 PM   Specimen: Anterior Nasal Swab  Result Value Ref Range Status   SARS Coronavirus 2 by RT PCR NEGATIVE NEGATIVE Final   Influenza A by PCR NEGATIVE NEGATIVE Final   Influenza B by PCR NEGATIVE NEGATIVE Final    Comment: (NOTE) The Xpert Xpress SARS-CoV-2/FLU/RSV plus assay is intended as an aid in the diagnosis of influenza from Nasopharyngeal swab specimens and should not be used as a sole basis for treatment. Nasal washings and aspirates are unacceptable for Xpert Xpress SARS-CoV-2/FLU/RSV testing.  Fact Sheet for Patients: BloggerCourse.com  Fact Sheet for Healthcare Providers: SeriousBroker.it  This test is not yet approved or cleared by the [place] FDA and has been authorized for detection and/or diagnosis of SARS-CoV-2 by FDA under an Emergency Use Authorization (EUA). This EUA will remain in effect (meaning this test can be used) for the duration of the COVID-19 declaration under Section 564(b)(1) of the Act, 21 U.S.C. section 360bbb-3(b)(1), unless the authorization is terminated or revoked.     Resp Syncytial Virus by PCR NEGATIVE NEGATIVE Final    Comment: (NOTE) Fact Sheet for Patients: BloggerCourse.com  Fact Sheet for Healthcare Providers: SeriousBroker.it  This test is not yet approved or cleared by the [place] FDA and has been authorized for detection and/or diagnosis of SARS-CoV-2 by FDA under an Emergency Use Authorization (EUA). This EUA will  remain in effect (meaning this test can be used) for the duration of the COVID-19 declaration under Section 564(b)(1) of the Act, 21 U.S.C. section 360bbb-3(b)(1), unless the authorization is terminated or revoked.  Performed at [facility], [address] [place], [place] [address]   Blood culture (routine x 2)     Status: None (Preliminary result)   Collection Time: 11/28/23  6:05 AM   Specimen: BLOOD  Result Value Ref Range Status   Specimen Description BLOOD RIGHT ANTECUBITAL  Final   Special Requests   Final    BOTTLES DRAWN AEROBIC AND ANAEROBIC Blood Culture adequate volume   Culture   Final    NO GROWTH 1 DAY Performed at [facility], [address], [place], [place] [address]    Report Status PENDING  Incomplete  Blood culture (routine x 2)     Status: None (Preliminary result)   Collection Time: 11/28/23  6:13 AM   Specimen: BLOOD LEFT HAND  Result Value Ref Range Status   Specimen Description BLOOD LEFT HAND  Final   Special Requests   Final    BOTTLES DRAWN AEROBIC AND ANAEROBIC Blood Culture adequate volume   Culture   Final    NO GROWTH 1 DAY Performed at [facility], [address] [place], [place] [address]    Report Status PENDING  Incomplete     Labs: BNP (last 3 results) No results for input(s): "BNP" in the last 8760 hours. Basic Metabolic Panel: Recent Labs  Lab 11/27/23 2351 11/28/23 0616 11/29/23 0257  NA 135 139 138  K 3.8 4.0 4.0  CL 103 106 104  CO2 24 23 26   GLUCOSE 144* 111* 113*  BUN 9 8 11   CREATININE 0.91 1.04 0.99  CALCIUM 8.5* 8.8* 8.9   Liver Function Tests: Recent Labs  Lab 11/27/23 2351 11/28/23 0616 11/29/23 0257  AST 44* 32 22  ALT 77* 68* 55*  ALKPHOS 90 81 77  BILITOT 0.8 0.8 0.9  PROT 6.1* 6.0* 5.8*  ALBUMIN 3.4* 3.2* 3.2*   No results for input(s): "LIPASE", "AMYLASE" in the last 168 hours. No results for input(s): "AMMONIA" in the last 168 hours. CBC: Recent Labs  Lab 11/27/23 2351  11/28/23 0616  WBC 5.4 4.4  NEUTROABS 4.0  --   HGB 13.7 13.6  HCT 39.3 39.9  MCV 84.9 86.0  PLT 186 171   Cardiac Enzymes: No results for input(s): "CKTOTAL", "CKMB", "CKMBINDEX", "TROPONINI" in the last 168 hours. BNP: Invalid input(s): "POCBNP" CBG: No results for input(s): "GLUCAP" in the last 168 hours. D-Dimer No results for input(s): "DDIMER" in the last  72 hours. Hgb A1c No results for input(s): "HGBA1C" in the last 72 hours. Lipid Profile No results for input(s): "CHOL", "HDL", "LDLCALC", "TRIG", "CHOLHDL", "LDLDIRECT" in the last 72 hours. Thyroid function studies No results for input(s): "TSH", "T4TOTAL", "T3FREE", "THYROIDAB" in the last 72 hours.  Invalid input(s): "FREET3" Anemia work up No results for input(s): "VITAMINB12", "FOLATE", "FERRITIN", "TIBC", "IRON", "RETICCTPCT" in the last 72 hours. Urinalysis    Component Value Date/Time   COLORURINE YELLOW 11/28/2023 0227   APPEARANCEUR CLEAR 11/28/2023 0227   LABSPEC 1.023 11/28/2023 0227   PHURINE 7.0 11/28/2023 0227   GLUCOSEU NEGATIVE 11/28/2023 0227   HGBUR NEGATIVE 11/28/2023 0227   BILIRUBINUR NEGATIVE 11/28/2023 0227   KETONESUR NEGATIVE 11/28/2023 0227   PROTEINUR NEGATIVE 11/28/2023 0227   NITRITE NEGATIVE 11/28/2023 0227   LEUKOCYTESUR NEGATIVE 11/28/2023 0227   Sepsis Labs Recent Labs  Lab 11/27/23 2351 11/28/23 0616  WBC 5.4 4.4   Microbiology Recent Results (from the past 240 hours)  Resp panel by RT-PCR (RSV, Flu A&B, Covid) Anterior Nasal Swab     Status: None   Collection Time: 11/27/23 11:51 PM   Specimen: Anterior Nasal Swab  Result Value Ref Range Status   SARS Coronavirus 2 by RT PCR NEGATIVE NEGATIVE Final   Influenza A by PCR NEGATIVE NEGATIVE Final   Influenza B by PCR NEGATIVE NEGATIVE Final    Comment: (NOTE) The Xpert Xpress SARS-CoV-2/FLU/RSV plus assay is intended as an aid in the diagnosis of influenza from Nasopharyngeal swab specimens and should not be used as  a sole basis for treatment. Nasal washings and aspirates are unacceptable for Xpert Xpress SARS-CoV-2/FLU/RSV testing.  Fact Sheet for Patients: BloggerCourse.com  Fact Sheet for Healthcare Providers: SeriousBroker.it  This test is not yet approved or cleared by the [place]  FDA and has been authorized for detection and/or diagnosis of SARS-CoV-2 by FDA under an Emergency Use Authorization (EUA). This EUA will remain in effect (meaning this test can be used) for the duration of the COVID-19 declaration under Section 564(b)(1) of the Act, 21 U.S.C. section 360bbb-3(b)(1), unless the authorization is terminated or revoked.     Resp Syncytial Virus by PCR NEGATIVE NEGATIVE Final    Comment: (NOTE) Fact Sheet for Patients: BloggerCourse.com  Fact Sheet for Healthcare Providers: SeriousBroker.it  This test is not yet approved or cleared by the [place]  FDA and has been authorized for detection and/or diagnosis of SARS-CoV-2 by FDA under an Emergency Use Authorization (EUA). This EUA will remain in effect (meaning this test can be used) for the duration of the COVID-19 declaration under Section 564(b)(1) of the Act, 21 U.S.C. section 360bbb-3(b)(1), unless the authorization is terminated or revoked.  Performed at [facility], [address] [place], [place] [address]   Blood culture (routine x 2)     Status: None (Preliminary result)   Collection Time: 11/28/23  6:05 AM   Specimen: BLOOD  Result Value Ref Range Status   Specimen Description BLOOD RIGHT ANTECUBITAL  Final   Special Requests   Final    BOTTLES DRAWN AEROBIC AND ANAEROBIC Blood Culture adequate volume   Culture   Final    NO GROWTH 1 DAY Performed at [facility], [address] [place], [place] [address]    Report Status PENDING  Incomplete  Blood culture (routine x 2)     Status: None  (Preliminary result)   Collection Time: 11/28/23  6:13 AM   Specimen: BLOOD LEFT HAND  Result  Value Ref Range Status   Specimen Description BLOOD LEFT HAND  Final   Special Requests   Final    BOTTLES DRAWN AEROBIC AND ANAEROBIC Blood Culture adequate volume   Culture   Final    NO GROWTH 1 DAY Performed at [facility], [address] [place], [place] [address]    Report Status PENDING  Incomplete     Patient was seen and examined on the day of discharge and was found to be in stable condition. Time coordinating discharge: 35 minutes including assessment and coordination of care, as well as examination of the patient.   SIGNED:  Daren Eck, DO [facility] 11/29/2023, 2:11 PM

## 2023-12-03 LAB — CULTURE, BLOOD (ROUTINE X 2)
Culture: NO GROWTH
Culture: NO GROWTH
Special Requests: ADEQUATE
Special Requests: ADEQUATE

## 2023-12-12 DIAGNOSIS — R55 Syncope and collapse: Secondary | ICD-10-CM | POA: Diagnosis not present

## 2023-12-19 ENCOUNTER — Ambulatory Visit (INDEPENDENT_AMBULATORY_CARE_PROVIDER_SITE_OTHER): Admitting: Podiatry

## 2023-12-19 ENCOUNTER — Encounter: Payer: Self-pay | Admitting: Podiatry

## 2023-12-19 DIAGNOSIS — M2170 Unequal limb length (acquired), unspecified site: Secondary | ICD-10-CM | POA: Diagnosis not present

## 2023-12-20 NOTE — Progress Notes (Signed)
  Subjective:  Patient ID: Dennis Blanchard, male    DOB: 12-28-1980,  MRN: [id number]  Chief Complaint  Patient presents with   Foot Orthotics    RM#11 Patient states had back surgery and has been wearing inserts and wants to discuss best course of treatment for his feet may have a leg length discrepancy.    Discussed the use of AI scribe software for clinical note transcription with the patient, who gave verbal consent to proceed.  History of Present Illness Dennis Blanchard is a 43 year old male with a history of back surgery and sciatica who presents with limb length discrepancy and left-sided sciatica.  He is three and a half weeks post-microdiscectomy and has experienced left-sided sciatica twice. Numbness is present in the first to third toes on the left foot, likely residual from surgery he was told.  Walking barefoot for extended periods causes pain in his foot or lower back. He generally wears shoes with type two different types of inserts (Good Feet and Powersteps). No current pain in his feet, except when walking barefoot. No nerve pain, numbness, tingling, or burning in his feet at present.       Objective:    Physical Exam General: AAO x3, NAD  Dermatological: Skin is warm, dry and supple bilateral. There are no open sores, no preulcerative lesions, no rash or signs of infection present.  Vascular: Dorsalis Pedis artery and Posterior Tibial artery pedal pulses are 2/4 bilateral with immedate capillary fill time.  There is no pain with calf compression, swelling, warmth, erythema.   Neruologic: Grossly intact via light touch bilateral. Negative tinel sign.   Musculoskeletal: Unable to appreciate any area pinpoint tenderness but there is no edema, erythema to the foot.  Flexor, extensor tendons are intact.  Ankle, subtalar joint range of motion intact.  The right side about half a centimeter shorter than the left upon measuring.    No images are attached to the encounter.     Results     Assessment:   1. Lower limb length difference      Plan:  Patient was evaluated and treated and all questions answered.  Assessment and Plan Assessment & Plan Post-surgical numbness in toes Numbness in first to third toes on left side, likely residual from recent microdiscectomy. Expected resolution in 2-3 months he was told.  Continue to monitor.  Limb length discrepancy Left leg 0.5 cm longer than right. Discussed trial of right side lift. Emphasized temporary nature until effectiveness confirmed. Explained potential for custom inserts if successful. - Trial lift on right side.  Heel dispensed today. - Monitor for discomfort or uneven pressure, remove if bothersome. - Consider custom inserts if lift trial successful.   No follow-ups on file.   Dennis Blanchard DPM

## 2024-01-16 ENCOUNTER — Ambulatory Visit: Admitting: Internal Medicine

## 2024-02-26 ENCOUNTER — Ambulatory Visit (INDEPENDENT_AMBULATORY_CARE_PROVIDER_SITE_OTHER): Admitting: Internal Medicine

## 2024-02-26 ENCOUNTER — Encounter: Payer: Self-pay | Admitting: Internal Medicine

## 2024-02-26 VITALS — BP 130/78 | HR 71 | Ht 73.0 in | Wt 211.0 lb

## 2024-02-26 DIAGNOSIS — R1319 Other dysphagia: Secondary | ICD-10-CM

## 2024-02-26 DIAGNOSIS — R490 Dysphonia: Secondary | ICD-10-CM | POA: Diagnosis not present

## 2024-02-26 DIAGNOSIS — K219 Gastro-esophageal reflux disease without esophagitis: Secondary | ICD-10-CM | POA: Diagnosis not present

## 2024-02-26 DIAGNOSIS — R1310 Dysphagia, unspecified: Secondary | ICD-10-CM | POA: Diagnosis not present

## 2024-02-26 DIAGNOSIS — R7989 Other specified abnormal findings of blood chemistry: Secondary | ICD-10-CM | POA: Diagnosis not present

## 2024-02-26 MED ORDER — FAMOTIDINE 20 MG PO TABS: 20.0000 mg | ORAL_TABLET | Freq: Two times a day (BID) | ORAL | 3 refills | Status: AC

## 2024-02-26 MED ORDER — OMEPRAZOLE 40 MG PO CPDR: 40.0000 mg | DELAYED_RELEASE_CAPSULE | Freq: Every day | ORAL | 3 refills | Status: AC

## 2024-02-26 NOTE — Progress Notes (Signed)
 Chief Complaint: GERD, dysphagia, hoarseness  HPI: Patient is a 43 year old male patient with past medical history of arthritis, GERD, hyperlipidemia, hypertension presents for follow up of GERD, dysphagia, hoarseness .    Paternal grandmother with uterine cancer in her 69s with MSH6 mutation  Interval History: He presents with his son and daughter who are 105 year old fraternal twins. He was on PPI BID about about a month after his EGD procedure and now he is now on PPI every day. He is also taking Pepcid  20 mg BID. His hoarseness is better. Reflux is better. Dysphagia has improved after esophageal dilation. He is still careful when he is swallowing. He likes the regimen that he is on right now. Denies N&V, ab pain, blood in the stools. Bowel habits are regular.   Wt Readings from Last 3 Encounters:  02/26/24 211 lb (95.7 kg)  11/29/23 199 lb 11.8 oz (90.6 kg)  11/24/23 201 lb (91.2 kg)    Past Medical History:  Diagnosis Date   Arthritis    Carpal tunnel syndrome of right wrist    Family history of Lynch syndrome    GERD (gastroesophageal reflux disease)    Hyperlipidemia    Hypertension    OA (osteoarthritis)    PONV (postoperative nausea and vomiting)    Past Surgical History:  Procedure Laterality Date   GUM SURGERY     LAMINECTOMY  2017   LUMBAR LAMINECTOMY/DECOMPRESSION MICRODISCECTOMY Left 11/24/2023   Procedure: LUMBAR LAMINECTOMY/DECOMPRESSION MICRODISCECTOMY 1 LEVEL;  Surgeon: Gillie Duncans, MD;  Location: [facility] OR;  Service: Neurosurgery;  Laterality: Left;  Microdiscectomy - left - Lumbar five-Sacral one   SKIN CANCER EXCISION  2019   VARICOCELECTOMY  2018   Current Outpatient Medications  Medication Sig Dispense Refill   docusate sodium  (COLACE) 100 MG capsule Take 100 mg by mouth 2 (two) times daily as needed for mild constipation.     ondansetron  (ZOFRAN -ODT) 4 MG disintegrating tablet Take 1 tablet (4 mg total) by mouth every 8 (eight) hours as needed for nausea or  vomiting. 20 tablet 0   cetirizine (ZYRTEC) 10 MG tablet Take 10 mg by mouth daily.     famotidine  (PEPCID ) 20 MG tablet Take 1 tablet (20 mg total) by mouth 2 (two) times daily. 180 tablet 3   gabapentin  (NEURONTIN ) 300 MG capsule Take 300 mg by mouth 3 (three) times daily. (Patient not taking: Reported on 12/19/2023)     Multiple Vitamin (MULTIVITAMIN WITH MINERALS) TABS tablet Take 1 tablet by mouth daily.     omeprazole  (PRILOSEC) 40 MG capsule Take 1 capsule (40 mg total) by mouth daily. 90 capsule 3   rosuvastatin (CRESTOR) 10 MG tablet Take 10 mg by mouth at bedtime.     No current facility-administered medications for this visit.   Allergies as of 02/26/2024 - Review Complete 02/26/2024  Allergen Reaction Noted   Amlodipine Swelling 09/13/2019   Lisinopril Swelling 09/13/2019   Family History  Problem Relation Age of Onset   Hypertension Mother    Depression Mother    Hyperlipidemia Father    Other Father        Multi system atrophy   Cataracts Maternal Grandmother    Colon cancer Maternal Grandmother    CVA Maternal Grandfather    Uterine cancer Paternal Grandmother        MSH6 mutation   Rheum arthritis Paternal Grandmother    Brain cancer Paternal Grandfather    Aneurysm Maternal Uncle  brain   Esophageal cancer Maternal Uncle    Lung cancer Maternal Uncle    Breast cancer Paternal Aunt      Physical Exam:  Vital signs: BP 130/78   Pulse 71   Ht 6' 1 (1.854 m)   Wt 211 lb (95.7 kg)   BMI 27.84 kg/m   Constitutional:   Pleasant Caucasian male appears to be in NAD, Well developed, Well nourished, alert and cooperative Neck:  Supple Respiratory: Respirations even and unlabored. Lungs clear to auscultation bilaterally.   No wheezes, crackles, or rhonchi.  Cardiovascular: Normal S1, S2. Regular rate and rhythm. Gastrointestinal:  Soft, nondistended, nontender. No rebound or guarding. Normal bowel sounds. No appreciable masses or hepatomegaly. Skin:   Dry  and intact without significant lesions or rashes. Psychiatric: Oriented to person, place and time. Demonstrates good judgement and reason without abnormal affect or behaviors.  RELEVANT LABS AND IMAGING: 08/23/2023 labs show: Creatinine 1.1, normal LFTs, BUN 16, platelets 291, WBC 5.6, hemoglobin 15.8, TSH 2.49  Labs 11/2023: CBC nml. CMP with low albumin of 3.2 and elevated ALT of 55.  EGD 10/27/23: - The examined esophagus was normal. A guidewire was placed and the scope was withdrawn. Dilation was performed with a Savary dilator with mild resistance at 19 mm. Biopsies were taken with a cold forceps for histology. - Localized inflammation characterized by congestion ( edema) , erosions and erythema was found in the gastric body. Biopsies were taken with a cold forceps for histology. - The examined duodenum was normal. Path: 1. Surgical [P], gastric :      GASTRIC ANTRAL / OXYNTIC MUCOSA WITH FOCAL MILD CHRONIC INACTIVE GASTRITIS.      NO H. PYLORI IDENTIFIED ON H&E STAIN.      NEGATIVE FOR INTESTINAL METAPLASIA OR DYSPLASIA.      2. Surgical [P], distal esophagus :      ESOPHAGEAL SQUAMOUS MUCOSA WITH NO SIGNIFICANT DIAGNOSTIC ALTERATION.      NO EVIDENCE OF INTRAEPITHELIAL EOSINOPHILS OR LYMPHOCYTES.      3. Surgical [P], proximal esophagus :      ESOPHAGEAL SQUAMOUS MUCOSA WITH NO SIGNIFICANT DIAGNOSTIC ALTERATION.      NO EVIDENCE OF INTRAEPITHELIAL EOSINOPHILS OR LYMPHOCYTES.   Assessment: Dysphagia GERD Hoarseness Mildly elevated LFTs  Patient has had improvement in his dysphagia after his recent esophageal dilation to 19 Fr with Savary dilator. He has had significant benefit on his current regimen of PPI every day and H2 blocker BID. His recent esophageal biopsies were negative for EoE. Gastric biopsies were negative for H pylori. Will continue his current reflux regimen. His FIB4 is 0.75, excluding advanced fibrosis. Can continue to trend liver tests over time. Suspect his mildly  elevated ALT is due to mild fatty liver.  Plan: - Continue omeprazole  40 mg every day. Will refill - Cont Famotidine  20 mg BID. Refilled - Continue reflux friendly diet - Colonoscopy for colon cancer screening due in 10/2025 - RTC 1 year  Estefana Kidney, MD [facility] 02/26/2024, 10:00 AM  I spent 26 minutes of time, including independent review of results as outlined above, communicating results with the patient directly, face-to-face time with the patient, coordinating care, ordering studies and medications as appropriate, and documentation.

## 2024-02-26 NOTE — Patient Instructions (Signed)
 Follow up in one year  _______________________________________________________  If your blood pressure at your visit was 140/90 or greater, please contact your primary care physician to follow up on this.  _______________________________________________________  If you are age 43 or older, your body mass index should be between 23-30. Your Body mass index is 27.84 kg/m. If this is out of the aforementioned range listed, please consider follow up with your Primary Care Provider.  If you are age 79 or younger, your body mass index should be between 19-25. Your Body mass index is 27.84 kg/m. If this is out of the aformentioned range listed, please consider follow up with your Primary Care Provider.   ________________________________________________________  The [facility] providers would like to encourage you to use MYCHART to communicate with providers for non-urgent requests or questions.  Due to long hold times on the telephone, sending your provider a message by [facility] may be a faster and more efficient way to get a response.  Please allow 48 business hours for a response.  Please remember that this is for non-urgent requests.  _______________________________________________________  Thank you for entrusting me with your care and for choosing [facility],  Dr. Estefana Kidney

## 2024-02-28 DIAGNOSIS — K2950 Unspecified chronic gastritis without bleeding: Secondary | ICD-10-CM | POA: Diagnosis not present

## 2024-09-09 ENCOUNTER — Encounter (HOSPITAL_COMMUNITY): Payer: Self-pay | Admitting: Internal Medicine
